# Patient Record
Sex: Female | Born: 1970 | Race: White | Hispanic: No | Marital: Married | Smoking: Former smoker
Health system: Southern US, Community
[De-identification: ages and names within clinical notes are randomized; demographics above are authoritative.]

## PROBLEM LIST (undated history)

## (undated) DIAGNOSIS — K219 Gastro-esophageal reflux disease without esophagitis: Secondary | ICD-10-CM

## (undated) DIAGNOSIS — Z973 Presence of spectacles and contact lenses: Secondary | ICD-10-CM

## (undated) DIAGNOSIS — K635 Polyp of colon: Secondary | ICD-10-CM

## (undated) DIAGNOSIS — B019 Varicella without complication: Secondary | ICD-10-CM

## (undated) DIAGNOSIS — B360 Pityriasis versicolor: Secondary | ICD-10-CM

## (undated) DIAGNOSIS — T7840XA Allergy, unspecified, initial encounter: Secondary | ICD-10-CM

## (undated) DIAGNOSIS — M1990 Unspecified osteoarthritis, unspecified site: Secondary | ICD-10-CM

## (undated) HISTORY — DX: Varicella without complication: B01.9

## (undated) HISTORY — PX: WISDOM TOOTH EXTRACTION: SHX21

## (undated) HISTORY — DX: Unspecified osteoarthritis, unspecified site: M19.90

## (undated) HISTORY — DX: Pityriasis versicolor: B36.0

## (undated) HISTORY — DX: Polyp of colon: K63.5

## (undated) HISTORY — DX: Gastro-esophageal reflux disease without esophagitis: K21.9

## (undated) HISTORY — DX: Allergy, unspecified, initial encounter: T78.40XA

---

## 2006-11-25 HISTORY — PX: ULNAR NERVE REPAIR: SHX2594

## 2016-03-13 DIAGNOSIS — Z1151 Encounter for screening for human papillomavirus (HPV): Secondary | ICD-10-CM | POA: Diagnosis not present

## 2016-03-13 DIAGNOSIS — Z1231 Encounter for screening mammogram for malignant neoplasm of breast: Secondary | ICD-10-CM | POA: Diagnosis not present

## 2016-03-13 DIAGNOSIS — Z01419 Encounter for gynecological examination (general) (routine) without abnormal findings: Secondary | ICD-10-CM | POA: Diagnosis not present

## 2017-04-10 ENCOUNTER — Encounter: Payer: Self-pay | Admitting: Obstetrics and Gynecology

## 2017-04-10 ENCOUNTER — Ambulatory Visit (INDEPENDENT_AMBULATORY_CARE_PROVIDER_SITE_OTHER): Payer: BLUE CROSS/BLUE SHIELD | Admitting: Obstetrics and Gynecology

## 2017-04-10 VITALS — BP 110/60 | HR 82 | Ht 67.0 in | Wt 155.0 lb

## 2017-04-10 DIAGNOSIS — Z01419 Encounter for gynecological examination (general) (routine) without abnormal findings: Secondary | ICD-10-CM | POA: Diagnosis not present

## 2017-04-10 DIAGNOSIS — Z30011 Encounter for initial prescription of contraceptive pills: Secondary | ICD-10-CM | POA: Diagnosis not present

## 2017-04-10 DIAGNOSIS — Z1231 Encounter for screening mammogram for malignant neoplasm of breast: Secondary | ICD-10-CM

## 2017-04-10 DIAGNOSIS — Z1239 Encounter for other screening for malignant neoplasm of breast: Secondary | ICD-10-CM

## 2017-04-10 DIAGNOSIS — Z760 Encounter for issue of repeat prescription: Secondary | ICD-10-CM | POA: Diagnosis not present

## 2017-04-10 MED ORDER — CIPROFLOXACIN HCL 500 MG PO TABS: 500.0000 mg | ORAL_TABLET | Freq: Two times a day (BID) | ORAL | 0 refills | Status: AC

## 2017-04-10 MED ORDER — LEVONORGESTREL-ETHINYL ESTRAD 0.1-20 MG-MCG PO TABS: 1.0000 | ORAL_TABLET | Freq: Every day | ORAL | 2 refills | Status: DC

## 2017-04-10 NOTE — Progress Notes (Signed)
Chief Complaint  Patient presents with  . Gynecologic Exam     HPI:      [name] is a 46 y.o. No obstetric history on file. who LMP was Patient's last menstrual period was 04/06/2017., presents today for her annual examination.  Her menses are regular every 28-30 days, lasting 3 days.  Dysmenorrhea mild, occurring first 1-2 days of flow. She does not have intermenstrual bleeding.  Sex activity: single partner, contraception - vasectomy.  Last Pap: March 13, 2016  Results were: no abnormalities /neg HPV DNA   Last mammogram: March 13, 2016  Results were: normal--routine follow-up in 12 months There is a FH of breast cancer in her mat grt aunt, genetic testing not indicated. There is no FH of ovarian cancer. The patient does do self-breast exams.  Tobacco use: The patient denies current or previous tobacco use. Alcohol use: social drinker Exercise: moderately active  She does get adequate calcium and Vitamin D in her diet.  She is going to [place] in July and would like a Rx for cipro. She also does not want to start her period there and is interested in trying OCPs. She took them in the past and did great. No hx of HTN, clotting disorders, migraine headaches with aura, tob use.   Past Medical History:  Diagnosis Date  . Tinea versicolor     Past Surgical History:  Procedure Laterality Date  . WISDOM TOOTH EXTRACTION      Family History  Problem Relation Age of Onset  . Breast cancer Other     Social History   Social History  . Marital status: Unknown    Spouse name: N/A  . Number of children: N/A  . Years of education: N/A   Occupational History  . Not on file.   Social History Main Topics  . Smoking status: Never Smoker  . Smokeless tobacco: Never Used  . Alcohol use Yes  . Drug use: No  . Sexual activity: Yes    Birth control/ protection: None   Other Topics Concern  . Not on file   Social History Narrative  . No narrative on file      Current Outpatient Prescriptions:  .  cetirizine HCl (ZYRTEC) 1 MG/ML solution, Take by mouth daily., Disp: , Rfl:  .  Probiotic Product (PROBIOTIC-10 PO), Take by mouth., Disp: , Rfl:  .  ciprofloxacin (CIPRO) 500 MG tablet, Take 1 tablet (500 mg total) by mouth 2 (two) times daily., Disp: 14 tablet, Rfl: 0 .  levonorgestrel-ethinyl estradiol (AVIANE) 0.1-20 MG-MCG tablet, Take 1 tablet by mouth daily., Disp: 28 tablet, Rfl: 2  ROS:  Review of Systems  Constitutional: Negative for fatigue, fever and unexpected weight change.  Respiratory: Negative for cough, shortness of breath and wheezing.   Cardiovascular: Negative for chest pain, palpitations and leg swelling.  Gastrointestinal: Negative for blood in stool, constipation, diarrhea, nausea and vomiting.  Endocrine: Negative for cold intolerance, heat intolerance and polyuria.  Genitourinary: Negative for dyspareunia, dysuria, flank pain, frequency, genital sores, hematuria, menstrual problem, pelvic pain, urgency, vaginal bleeding, vaginal discharge and vaginal pain.  Musculoskeletal: Negative for back pain, joint swelling and myalgias.  Skin: Negative for rash.  Neurological: Negative for dizziness, syncope, light-headedness, numbness and headaches.  Hematological: Negative for adenopathy.  Psychiatric/Behavioral: Negative for agitation, confusion, sleep disturbance and suicidal ideas. The patient is not nervous/anxious.      Objective: BP 110/60   Pulse 82   Ht 5\' 7"  (1.702 m)  Wt 155 lb (70.3 kg)   LMP 04/06/2017   BMI 24.28 kg/m    Physical Exam  Constitutional: She is oriented to person, place, and time. She appears well-developed and well-nourished.  Genitourinary: Vagina normal and uterus normal. There is no rash or tenderness on the right labia. There is no rash or tenderness on the left labia. No erythema or tenderness in the vagina. No vaginal discharge found. Right adnexum does not display mass and does not  display tenderness. Left adnexum does not display mass and does not display tenderness. Cervix does not exhibit motion tenderness or polyp. Uterus is not enlarged or tender.  Neck: Normal range of motion. No thyromegaly present.  Cardiovascular: Normal rate, regular rhythm and normal heart sounds.   No murmur heard. Pulmonary/Chest: Effort normal and breath sounds normal. Right breast exhibits no mass, no nipple discharge, no skin change and no tenderness. Left breast exhibits no mass, no nipple discharge, no skin change and no tenderness.  Abdominal: Soft. There is no tenderness. There is no guarding.  Musculoskeletal: Normal range of motion.  Neurological: She is alert and oriented to person, place, and time. No cranial nerve deficit.  Psychiatric: She has a normal mood and affect. Her behavior is normal.  Vitals reviewed.   Assessment/Plan: Encounter for annual routine gynecological examination  Screening for breast cancer - Pt to sched mammo. - Plan: MM DIGITAL SCREENING BILATERAL  Encounter for initial prescription of contraceptive pills - OCP start to prevent menses in [place]. Rx aviane. F/u prn. - Plan: levonorgestrel-ethinyl estradiol (AVIANE) 0.1-20 MG-MCG tablet  Prescription refill - Rx cipro eRxd to take to [place] in case of travelers diarrhea.  - Plan: ciprofloxacin (CIPRO) 500 MG tablet             GYN counsel mammography screening, menopause, adequate intake of calcium and vitamin D, diet and exercise     F/U  Return in about 1 year (around 04/10/2018).  Alicia B. Copland, PA-C 04/10/2017 2:35 PM

## 2017-06-06 ENCOUNTER — Other Ambulatory Visit: Payer: Self-pay

## 2017-06-06 DIAGNOSIS — Z30011 Encounter for initial prescription of contraceptive pills: Secondary | ICD-10-CM

## 2017-06-06 MED ORDER — LEVONORGESTREL-ETHINYL ESTRAD 0.1-20 MG-MCG PO TABS: 1.0000 | ORAL_TABLET | Freq: Every day | ORAL | 0 refills | Status: DC

## 2017-06-10 NOTE — Telephone Encounter (Signed)
This encounter was created in error - please disregard.

## 2017-10-01 ENCOUNTER — Ambulatory Visit
Admission: RE | Admit: 2017-10-01 | Discharge: 2017-10-01 | Disposition: A | Discharge status: Home / Self Care | Payer: BLUE CROSS/BLUE SHIELD | Source: Ambulatory Visit | Attending: Obstetrics and Gynecology | Admitting: Obstetrics and Gynecology

## 2017-10-01 ENCOUNTER — Encounter: Payer: Self-pay | Admitting: Radiology

## 2017-10-01 DIAGNOSIS — Z1231 Encounter for screening mammogram for malignant neoplasm of breast: Secondary | ICD-10-CM | POA: Insufficient documentation

## 2017-10-01 DIAGNOSIS — Z1239 Encounter for other screening for malignant neoplasm of breast: Secondary | ICD-10-CM

## 2017-10-06 ENCOUNTER — Other Ambulatory Visit: Payer: Self-pay | Admitting: *Deleted

## 2017-10-06 ENCOUNTER — Inpatient Hospital Stay
Admission: RE | Admit: 2017-10-06 | Discharge: 2017-10-06 | Disposition: A | Discharge status: Home / Self Care | Payer: Self-pay | Source: Ambulatory Visit | Attending: *Deleted | Admitting: *Deleted

## 2017-10-06 DIAGNOSIS — Z9289 Personal history of other medical treatment: Secondary | ICD-10-CM

## 2017-10-07 ENCOUNTER — Encounter: Payer: Self-pay | Admitting: Obstetrics and Gynecology

## 2018-01-08 DIAGNOSIS — H6691 Otitis media, unspecified, right ear: Secondary | ICD-10-CM | POA: Diagnosis not present

## 2018-09-22 ENCOUNTER — Encounter: Payer: Self-pay | Admitting: Internal Medicine

## 2018-09-22 ENCOUNTER — Ambulatory Visit (INDEPENDENT_AMBULATORY_CARE_PROVIDER_SITE_OTHER): Payer: BLUE CROSS/BLUE SHIELD | Admitting: Internal Medicine

## 2018-09-22 VITALS — BP 104/68 | HR 70 | Temp 98.5°F | Resp 15 | Ht 67.0 in | Wt 160.0 lb

## 2018-09-22 DIAGNOSIS — E559 Vitamin D deficiency, unspecified: Secondary | ICD-10-CM

## 2018-09-22 DIAGNOSIS — Z13220 Encounter for screening for lipoid disorders: Secondary | ICD-10-CM

## 2018-09-22 DIAGNOSIS — K9041 Non-celiac gluten sensitivity: Secondary | ICD-10-CM

## 2018-09-22 DIAGNOSIS — Z0000 Encounter for general adult medical examination without abnormal findings: Secondary | ICD-10-CM

## 2018-09-22 DIAGNOSIS — Z1239 Encounter for other screening for malignant neoplasm of breast: Secondary | ICD-10-CM | POA: Diagnosis not present

## 2018-09-22 DIAGNOSIS — R5383 Other fatigue: Secondary | ICD-10-CM

## 2018-09-22 DIAGNOSIS — Z1211 Encounter for screening for malignant neoplasm of colon: Secondary | ICD-10-CM

## 2018-09-22 DIAGNOSIS — K635 Polyp of colon: Secondary | ICD-10-CM

## 2018-09-22 NOTE — Patient Instructions (Addendum)
GREAT TO MEET YOU!!!  Tell Richard I'm proud of him!  I have ordered labs.  You only need a 4 hour fast.    Remind me in January to make the referral for your colonoscopy   Your mammogram has been ordered.  You can call to make your appointment at [facility]  [phone]   My cell phone  For emergencies is [phone]

## 2018-09-22 NOTE — Progress Notes (Signed)
Subjective:  Patient ID: Brandi Jones, female    DOB: 10/21/1971  Age: 47 y.o. MRN: [id number]  CC: The primary encounter diagnosis was Breast cancer screening. Diagnoses of Vitamin D deficiency, Fatigue, unspecified type, Screening for hyperlipidemia, Encounter for preventive health examination, Non-celiac gluten sensitivity, and Hyperplastic colonic polyp, unspecified part of colon were also pertinent to this visit.  HPI [name] presents for establishment of care .  Want to see Dr. Tobi Bastos for colonoscopy which is due on 2020 .  Marland KitchenHistory of polyp found 5 years ago during diagnotic colonoscopy .  Had some GI issues  Found to be gluten sensitive but not celiac disease.   Exercising regularly , using the [employer]  bike 3 to 4 days weekly, also does hot yoga   .  Some crepitus of knees but no effusions or  pain.   Takes magnesium  at night for sleep, finds it also relaxes muscles,  Fewer cramps.   Adds scoop of collagen peptides in coffee for the past 3 months.    History Masiel has a past medical history of Allergy, Arthritis, Colon polyps, GERD (gastroesophageal reflux disease), Tinea versicolor, and Varicella.   She has a past surgical history that includes Wisdom tooth extraction and Ulnar nerve repair (2008).   Her family history includes Arthritis in her mother; Breast cancer in her other; Celiac disease in her mother; Early death (age of onset: 9) in her father; Hearing loss in her mother.She reports that she has never smoked. She has never used smokeless tobacco. She reports that she drinks alcohol. She reports that she does not use drugs.  Outpatient Medications Prior to Visit  Medication Sig Dispense Refill  . cetirizine HCl (ZYRTEC) 1 MG/ML solution Take by mouth daily.    . Magnesium 500 MG CAPS Take by mouth daily.    . Probiotic Product (PROBIOTIC-10 PO) Take by mouth.    . levonorgestrel-ethinyl estradiol (AVIANE) 0.1-20 MG-MCG tablet Take 1 tablet by mouth  daily. (Patient not taking: Reported on 09/22/2018) 84 tablet 0   No facility-administered medications prior to visit.     Review of Systems:  Patient denies headache, fevers, malaise, unintentional weight loss, skin rash, eye pain, sinus congestion and sinus pain, sore throat, dysphagia,  hemoptysis , cough, dyspnea, wheezing, chest pain, palpitations, orthopnea, edema, abdominal pain, nausea, melena, diarrhea, constipation, flank pain, dysuria, hematuria, urinary  Frequency, nocturia, numbness, tingling, seizures,  Focal weakness, Loss of consciousness,  Tremor, insomnia, depression, anxiety, and suicidal ideation.     Objective:  BP 104/68 (BP Location: Left Arm, Patient Position: Sitting, Cuff Size: Normal)   Pulse 70   Temp 98.5 F (36.9 C) (Oral)   Resp 15   Ht 5\' 7"  (1.702 m)   Wt 160 lb (72.6 kg)   LMP 09/08/2018   SpO2 97%   BMI 25.06 kg/m   Physical Exam:  General appearance: alert, cooperative and appears stated age Ears: normal TM's and external ear canals both ears Throat: lips, mucosa, and tongue normal; teeth and gums normal Neck: no adenopathy, no carotid bruit, supple, symmetrical, trachea midline and thyroid not enlarged, symmetric, no tenderness/mass/nodules Back: symmetric, no curvature. ROM normal. No CVA tenderness. Lungs: clear to auscultation bilaterally Heart: regular rate and rhythm, S1, S2 normal, no murmur, click, rub or gallop Abdomen: soft, non-tender; bowel sounds normal; no masses,  no organomegaly Pulses: 2+ and symmetric Skin: Skin color, texture, turgor normal. No rashes or lesions Lymph nodes: Cervical, supraclavicular, and axillary nodes normal.  Assessment & Plan:   Problem List Items Addressed This Visit    Encounter for preventive health examination    age appropriate education and counseling updated, referrals for preventative services and immunizations addressed, dietary and smoking counseling addressed, most recent labs reviewed.   I have personally reviewed and have noted:  1) the patient's medical and social history 2) The pt's use of alcohol, tobacco, and illicit drugs 3) The patient's current medications and supplements 4) Functional ability including ADL's, fall risk, home safety risk, hearing and visual impairment 5) Diet and physical activities 6) Evidence for depression or mood disorder 7) The patient's height, weight, and BMI have been recorded in the chart  I have made referrals, and provided counseling and education based on review of the above      Hyperplastic colon polyp    Presumed,  Records not available.  Found during diagnostic colonoscopy at age 54 during workup for persistent GI distress.       Non-celiac gluten sensitivity    She continues to enjoy normal bowel function on an 80% gluten free diet. Prior screening for celiac disease was negative        Other Visit Diagnoses    Breast cancer screening    -  Primary   Relevant Orders   MM 3D SCREEN BREAST BILATERAL   Vitamin D deficiency       Relevant Orders   VITAMIN D 25 Hydroxy (Vit-D Deficiency, Fractures)   Fatigue, unspecified type       Relevant Orders   TSH   Comprehensive metabolic panel   CBC with Differential/Platelet   Screening for hyperlipidemia       Relevant Orders   Lipid panel      I have discontinued Dalia Heading levonorgestrel-ethinyl estradiol. I am also having her maintain her Probiotic Product (PROBIOTIC-10 PO), cetirizine HCl, and Magnesium.  No orders of the defined types were placed in this encounter.   Medications Discontinued During This Encounter  Medication Reason  . levonorgestrel-ethinyl estradiol (AVIANE) 0.1-20 MG-MCG tablet Entry Error    Follow-up: No follow-ups on file.   Sherlene Shams, MD

## 2018-09-23 ENCOUNTER — Encounter: Payer: Self-pay | Admitting: Internal Medicine

## 2018-09-23 DIAGNOSIS — K9041 Non-celiac gluten sensitivity: Secondary | ICD-10-CM | POA: Insufficient documentation

## 2018-09-23 DIAGNOSIS — K635 Polyp of colon: Secondary | ICD-10-CM | POA: Insufficient documentation

## 2018-09-23 DIAGNOSIS — Z0000 Encounter for general adult medical examination without abnormal findings: Secondary | ICD-10-CM | POA: Insufficient documentation

## 2018-09-23 NOTE — Assessment & Plan Note (Signed)
She continues to enjoy normal bowel function on an 80% gluten free diet. Prior screening for celiac disease was negative

## 2018-09-23 NOTE — Assessment & Plan Note (Signed)
Presumed,  Records not available.  Found during diagnostic colonoscopy at age 47 during workup for persistent GI distress.

## 2018-09-23 NOTE — Assessment & Plan Note (Signed)

## 2018-10-02 ENCOUNTER — Other Ambulatory Visit (INDEPENDENT_AMBULATORY_CARE_PROVIDER_SITE_OTHER): Payer: BLUE CROSS/BLUE SHIELD

## 2018-10-02 DIAGNOSIS — Z13220 Encounter for screening for lipoid disorders: Secondary | ICD-10-CM

## 2018-10-02 DIAGNOSIS — R5383 Other fatigue: Secondary | ICD-10-CM | POA: Diagnosis not present

## 2018-10-02 DIAGNOSIS — E559 Vitamin D deficiency, unspecified: Secondary | ICD-10-CM

## 2018-10-02 LAB — COMPREHENSIVE METABOLIC PANEL
ALBUMIN: 4.4 g/dL (ref 3.5–5.2)
ALK PHOS: 30 U/L — AB (ref 39–117)
ALT: 14 U/L (ref 0–35)
AST: 15 U/L (ref 0–37)
BUN: 15 mg/dL (ref 6–23)
CHLORIDE: 101 meq/L (ref 96–112)
CO2: 31 meq/L (ref 19–32)
Calcium: 9.5 mg/dL (ref 8.4–10.5)
Creatinine, Ser: 0.89 mg/dL (ref 0.40–1.20)
GFR: 71.42 mL/min (ref >60.00)
Glucose, Bld: 104 mg/dL — ABNORMAL HIGH (ref 70–99)
POTASSIUM: 4.6 meq/L (ref 3.5–5.1)
SODIUM: 138 meq/L (ref 135–145)
Total Bilirubin: 0.8 mg/dL (ref 0.2–1.2)
Total Protein: 7 g/dL (ref 6.0–8.3)

## 2018-10-02 LAB — CBC WITH DIFFERENTIAL/PLATELET
BASOS PCT: 0.7 % (ref 0.0–3.0)
Basophils Absolute: 0 10*3/uL (ref 0.0–0.1)
Eosinophils Absolute: 0.1 10*3/uL (ref 0.0–0.7)
Eosinophils Relative: 1.5 % (ref 0.0–5.0)
HCT: 40.9 % (ref 36.0–46.0)
HEMOGLOBIN: 13.8 g/dL (ref 12.0–15.0)
LYMPHS PCT: 29.9 % (ref 12.0–46.0)
Lymphs Abs: 1.6 10*3/uL (ref 0.7–4.0)
MCHC: 33.7 g/dL (ref 30.0–36.0)
MCV: 95.4 fl (ref 78.0–100.0)
MONO ABS: 0.5 10*3/uL (ref 0.1–1.0)
Monocytes Relative: 9.1 % (ref 3.0–12.0)
Neutro Abs: 3.1 10*3/uL (ref 1.4–7.7)
Neutrophils Relative %: 58.8 % (ref 43.0–77.0)
Platelets: 185 10*3/uL (ref 150.0–400.0)
RBC: 4.29 Mil/uL (ref 3.87–5.11)
RDW: 12.4 % (ref 11.5–15.5)
WBC: 5.2 10*3/uL (ref 4.0–10.5)

## 2018-10-02 LAB — LIPID PANEL
CHOLESTEROL: 203 mg/dL — AB (ref 0–200)
HDL: 75.8 mg/dL (ref >39.00)
LDL CALC: 117 mg/dL — AB (ref 0–99)
NonHDL: 127.51
TRIGLYCERIDES: 54 mg/dL (ref 0.0–149.0)
Total CHOL/HDL Ratio: 3
VLDL: 10.8 mg/dL (ref 0.0–40.0)

## 2018-10-02 LAB — VITAMIN D 25 HYDROXY (VIT D DEFICIENCY, FRACTURES): VITD: 28.49 ng/mL — AB (ref 30.00–100.00)

## 2018-10-02 LAB — TSH: TSH: 2.35 u[IU]/mL (ref 0.35–4.50)

## 2018-10-05 ENCOUNTER — Encounter: Payer: Self-pay | Admitting: *Deleted

## 2018-10-13 ENCOUNTER — Ambulatory Visit
Admission: RE | Admit: 2018-10-13 | Discharge: 2018-10-13 | Disposition: A | Discharge status: Home / Self Care | Payer: BLUE CROSS/BLUE SHIELD | Source: Ambulatory Visit | Attending: Internal Medicine | Admitting: Internal Medicine

## 2018-10-13 DIAGNOSIS — Z1231 Encounter for screening mammogram for malignant neoplasm of breast: Secondary | ICD-10-CM | POA: Diagnosis not present

## 2018-10-13 DIAGNOSIS — Z1239 Encounter for other screening for malignant neoplasm of breast: Secondary | ICD-10-CM | POA: Diagnosis not present

## 2018-11-03 ENCOUNTER — Other Ambulatory Visit: Payer: Self-pay | Admitting: Internal Medicine

## 2018-11-03 MED ORDER — LEVOFLOXACIN 500 MG PO TABS: 500.0000 mg | ORAL_TABLET | Freq: Every day | ORAL | 0 refills | Status: DC

## 2018-11-03 MED ORDER — OSELTAMIVIR PHOSPHATE 75 MG PO CAPS: 75.0000 mg | ORAL_CAPSULE | Freq: Every day | ORAL | 0 refills | Status: DC

## 2018-11-03 MED ORDER — PREDNISONE 10 MG PO TABS: ORAL_TABLET | ORAL | 0 refills | Status: DC

## 2018-11-26 ENCOUNTER — Telehealth: Payer: Self-pay | Admitting: Gastroenterology

## 2018-11-26 ENCOUNTER — Telehealth: Payer: Self-pay

## 2018-11-26 ENCOUNTER — Other Ambulatory Visit: Payer: Self-pay

## 2018-11-26 DIAGNOSIS — Z1211 Encounter for screening for malignant neoplasm of colon: Secondary | ICD-10-CM

## 2018-11-26 NOTE — Telephone Encounter (Signed)
Contacted patient LVM for her to call office to schedule her for a screening colonoscopy will be due in February when she turns 48.  Referral will need to be re-entered when she calls back to schedule.  Thanks [employer]

## 2018-11-26 NOTE — Telephone Encounter (Signed)
Call returned patient scheduled with Dr. T[facility] Tobi BastosAnna. New referral entered.  Thanks [employer]

## 2018-11-26 NOTE — Telephone Encounter (Signed)
-----   Message from Avie Arenas, [place] sent at 09/23/2018  2:27 PM EDT ----- Regarding: Colonoscopy Due Feb 2020 Call pt to schedule colonoscopy with Dr. Tobi Bastos.  Its due 12/2018.  Thanks [employer]

## 2018-11-26 NOTE — Telephone Encounter (Signed)
Patient was returning call to schedule a colonoscopy.

## 2019-01-05 ENCOUNTER — Encounter: Payer: Self-pay | Admitting: *Deleted

## 2019-01-05 ENCOUNTER — Other Ambulatory Visit: Payer: Self-pay

## 2019-01-08 NOTE — Discharge Instructions (Signed)
General Anesthesia, Adult, Care After  This sheet gives you information about how to care for yourself after your procedure. Your health care provider may also give you more specific instructions. If you have problems or questions, contact your health care provider.  What can I expect after the procedure?  After the procedure, the following side effects are common:  Pain or discomfort at the IV site.  Nausea.  Vomiting.  Sore throat.  Trouble concentrating.  Feeling cold or chills.  Weak or tired.  Sleepiness and fatigue.  Soreness and body aches. These side effects can affect parts of the body that were not involved in surgery.  Follow these instructions at home:    For at least 24 hours after the procedure:  Have a responsible adult stay with you. It is important to have someone help care for you until you are awake and alert.  Rest as needed.  Do not:  Participate in activities in which you could fall or become injured.  Drive.  Use heavy machinery.  Drink alcohol.  Take sleeping pills or medicines that cause drowsiness.  Make important decisions or sign legal documents.  Take care of children on your own.  Eating and drinking  Follow any instructions from your health care provider about eating or drinking restrictions.  When you feel hungry, start by eating small amounts of foods that are soft and easy to digest (bland), such as toast. Gradually return to your regular diet.  Drink enough fluid to keep your urine pale yellow.  If you vomit, rehydrate by drinking water, juice, or clear broth.  General instructions  If you have sleep apnea, surgery and certain medicines can increase your risk for breathing problems. Follow instructions from your health care provider about wearing your sleep device:  Anytime you are sleeping, including during daytime naps.  While taking prescription pain medicines, sleeping medicines, or medicines that make you drowsy.  Return to your normal activities as told by your health care  provider. Ask your health care provider what activities are safe for you.  Take over-the-counter and prescription medicines only as told by your health care provider.  If you smoke, do not smoke without supervision.  Keep all follow-up visits as told by your health care provider. This is important.  Contact a health care provider if:  You have nausea or vomiting that does not get better with medicine.  You cannot eat or drink without vomiting.  You have pain that does not get better with medicine.  You are unable to pass urine.  You develop a skin rash.  You have a fever.  You have redness around your IV site that gets worse.  Get help right away if:  You have difficulty breathing.  You have chest pain.  You have blood in your urine or stool, or you vomit blood.  Summary  After the procedure, it is common to have a sore throat or nausea. It is also common to feel tired.  Have a responsible adult stay with you for the first 24 hours after general anesthesia. It is important to have someone help care for you until you are awake and alert.  When you feel hungry, start by eating small amounts of foods that are soft and easy to digest (bland), such as toast. Gradually return to your regular diet.  Drink enough fluid to keep your urine pale yellow.  Return to your normal activities as told by your health care provider. Ask your health care   provider what activities are safe for you.  This information is not intended to replace advice given to you by your health care provider. Make sure you discuss any questions you have with your health care provider.  Document Released: 02/17/2001 Document Revised: 06/27/2017 Document Reviewed: 06/27/2017  Elsevier Interactive Patient Education  2019 Elsevier Inc[employer]

## 2019-01-13 ENCOUNTER — Ambulatory Visit
Admission: RE | Admit: 2019-01-13 | Discharge: 2019-01-13 | Disposition: A | Discharge status: Home / Self Care | Payer: BLUE CROSS/BLUE SHIELD | Attending: Gastroenterology | Admitting: Gastroenterology

## 2019-01-13 ENCOUNTER — Ambulatory Visit: Payer: BLUE CROSS/BLUE SHIELD | Admitting: Anesthesiology

## 2019-01-13 ENCOUNTER — Encounter
Admission: RE | Disposition: A | Discharge status: Home / Self Care | Payer: Self-pay | Source: Home / Self Care | Attending: Gastroenterology

## 2019-01-13 DIAGNOSIS — Z79899 Other long term (current) drug therapy: Secondary | ICD-10-CM | POA: Diagnosis not present

## 2019-01-13 DIAGNOSIS — Z86010 Personal history of colonic polyps: Secondary | ICD-10-CM | POA: Diagnosis not present

## 2019-01-13 DIAGNOSIS — Z1211 Encounter for screening for malignant neoplasm of colon: Secondary | ICD-10-CM | POA: Insufficient documentation

## 2019-01-13 DIAGNOSIS — Z87891 Personal history of nicotine dependence: Secondary | ICD-10-CM | POA: Insufficient documentation

## 2019-01-13 DIAGNOSIS — K219 Gastro-esophageal reflux disease without esophagitis: Secondary | ICD-10-CM | POA: Insufficient documentation

## 2019-01-13 HISTORY — PX: COLONOSCOPY WITH PROPOFOL: SHX5780

## 2019-01-13 HISTORY — DX: Presence of spectacles and contact lenses: Z97.3

## 2019-01-13 LAB — POCT PREGNANCY, URINE: Preg Test, Ur: NEGATIVE

## 2019-01-13 SURGERY — COLONOSCOPY WITH PROPOFOL
Anesthesia: General

## 2019-01-13 MED ORDER — LIDOCAINE HCL (CARDIAC) PF 100 MG/5ML IV SOSY
PREFILLED_SYRINGE | INTRAVENOUS | Status: DC | PRN
  Administered 2019-01-13: 30 mg via INTRAVENOUS

## 2019-01-13 MED ORDER — LACTATED RINGERS IV SOLN
INTRAVENOUS | Status: DC
  Administered 2019-01-13 (×2): via INTRAVENOUS

## 2019-01-13 MED ORDER — PROPOFOL 10 MG/ML IV BOLUS
INTRAVENOUS | Status: DC | PRN
  Administered 2019-01-13 (×2): 50 mg via INTRAVENOUS
  Administered 2019-01-13: 70 mg via INTRAVENOUS
  Administered 2019-01-13: 50 mg via INTRAVENOUS
  Administered 2019-01-13: 30 mg via INTRAVENOUS
  Administered 2019-01-13 (×5): 50 mg via INTRAVENOUS

## 2019-01-13 MED ORDER — ACETAMINOPHEN 325 MG PO TABS: 325.0000 mg | ORAL_TABLET | ORAL | Status: DC | PRN

## 2019-01-13 MED ORDER — ACETAMINOPHEN 160 MG/5ML PO SOLN: 325.0000 mg | ORAL | Status: DC | PRN

## 2019-01-13 SURGICAL SUPPLY — 24 items

## 2019-01-13 NOTE — H&P (Signed)
Wyline Mood, MD [address], [place], [place], [address] [address], [place], [place], [address] Phone: [phone]  Fax: [phone]  Primary Care Physician:  Sherlene Shams, MD   Pre-Procedure History & Physical: HPI:  Brandi Jones is a 48 y.o. female is here for an colonoscopy.   Past Medical History:  Diagnosis Date  . Allergy   . Arthritis    hands  . Colon polyps   . GERD (gastroesophageal reflux disease)   . Tinea versicolor   . Varicella   . Wears contact lenses     Past Surgical History:  Procedure Laterality Date  . ULNAR NERVE REPAIR  2008  . WISDOM TOOTH EXTRACTION      Prior to Admission medications   Medication Sig Start Date End Date Taking? Authorizing Provider  esomeprazole (NEXIUM) 20 MG capsule Take 20 mg by mouth daily as needed.   Yes [provider]  Multiple Vitamin (MULTIVITAMIN) tablet Take 1 tablet by mouth daily.   Yes [provider]  Probiotic Product (PROBIOTIC-10 PO) Take by mouth.   Yes [provider]  cetirizine HCl (ZYRTEC) 1 MG/ML solution Take by mouth daily.    [provider]    Allergies as of 11/26/2018 - Review Complete 09/22/2018  Allergen Reaction Noted  . Penicillin g Other (See Comments) 03/03/2015    Family History  Problem Relation Age of Onset  . Breast cancer Other   . Arthritis Mother   . Hearing loss Mother   . Celiac disease Mother   . Early death Father 76       car accident  . Breast cancer Maternal Aunt        Great MAT    Social History   Socioeconomic History  . Marital status: Married    Spouse name: Not on file  . Number of children: Not on file  . Years of education: Not on file  . Highest education level: Not on file  Occupational History  . Not on file  Social Needs  . Financial resource strain: Not on file  . Food insecurity:    Worry: Not on file    Inability: Not on file  . Transportation needs:    Medical:  Not on file    Non-medical: Not on file  Tobacco Use  . Smoking status: Former Games developer  . Smokeless tobacco: Never Used  . Tobacco comment: Smoked socially in her 86s  Substance and Sexual Activity  . Alcohol use: Yes    Alcohol/week: 6.0 standard drinks    Types: 6 Glasses of wine per week  . Drug use: No  . Sexual activity: Yes    Birth control/protection: None  Lifestyle  . Physical activity:    Days per week: Not on file    Minutes per session: Not on file  . Stress: Not on file  Relationships  . Social connections:    Talks on phone: Not on file    Gets together: Not on file    Attends religious service: Not on file    Active member of club or organization: Not on file    Attends meetings of clubs or organizations: Not on file    Relationship status: Not on file  . Intimate partner violence:    Fear of current or ex partner: Not on file    Emotionally abused: Not on file    Physically abused: Not on file    Forced sexual  activity: Not on file  Other Topics Concern  . Not on file  Social History Narrative  . Not on file    Review of Systems: See HPI, otherwise negative ROS  Physical Exam: BP 105/71   Pulse 60   Temp 98.4 F (36.9 C) (Temporal)   Ht 5\' 7"  (1.702 m)   Wt 71.2 kg   LMP 11/11/2018 (Approximate)   SpO2 99%   BMI 24.59 kg/m  General:   Alert,  pleasant and cooperative in NAD Head:  Normocephalic and atraumatic. Neck:  Supple; no masses or thyromegaly. Lungs:  Clear throughout to auscultation, normal respiratory effort.    Heart:  +S1, +S2, Regular rate and rhythm, No edema. Abdomen:  Soft, nontender and nondistended. Normal bowel sounds, without guarding, and without rebound.   Neurologic:  Alert and  oriented x4;  grossly normal neurologically.  Impression/Plan: Brandi Jones is here for an colonoscopy to be performed for Screening colonoscopy average risk   Risks, benefits, limitations, and alternatives regarding  colonoscopy have been  reviewed with the patient.  Questions have been answered.  All parties agreeable.   Wyline Mood, MD  01/13/2019, 7:39 AM

## 2019-01-13 NOTE — Op Note (Signed)
[facility] Gastroenterology Patient Name: Brandi Jones Procedure Date: 01/13/2019 7:09 AM MRN: [id number] Account #: [id number] Date of Birth: 1971-03-06 Admit Type: Outpatient Age: 48 Room: [facility] OR ROOM 01 Gender: Female Note Status: Finalized Procedure:            Colonoscopy Indications:          Screening for colorectal malignant neoplasm Providers:            Wyline Mood MD, MD Medicines:            Monitored Anesthesia Care Complications:        No immediate complications. Procedure:            Pre-Anesthesia Assessment:                       - Prior to the procedure, a History and Physical was                        performed, and patient medications, allergies and                        sensitivities were reviewed. The patient's tolerance of                        previous anesthesia was reviewed.                       - The risks and benefits of the procedure and the                        sedation options and risks were discussed with the                        patient. All questions were answered and informed                        consent was obtained.                       - ASA Grade Assessment: II - A patient with mild                        systemic disease.                       After obtaining informed consent, the colonoscope was                        passed under direct vision. Throughout the procedure,                        the patient's blood pressure, pulse, and oxygen                        saturations were monitored continuously. The                        Colonoscope was introduced through the anus and                        advanced to the the cecum, identified by the  appendiceal orifice, IC valve and transillumination.                        The colonoscopy was performed with ease. The patient                        tolerated the procedure well. The quality of the bowel                        preparation was  good. Findings:      The perianal and digital rectal examinations were normal.      The entire examined colon appeared normal on direct and retroflexion       views. Impression:           - The entire examined colon is normal on direct and                        retroflexion views.                       - No specimens collected. Recommendation:       - Discharge patient to home (with escort).                       - Resume previous diet.                       - Continue present medications.                       - Repeat colonoscopy in 10 years for screening purposes. Procedure Code(s):    --- Professional ---                       [phone], Colonoscopy, flexible; diagnostic, including                        collection of specimen(s) by brushing or washing, when                        performed (separate procedure) Diagnosis Code(s):    --- Professional ---                       Z12.11, Encounter for screening for malignant neoplasm                        of colon CPT copyright 2018 American Medical Association. All rights reserved. The codes documented in this report are preliminary and upon coder review may  be revised to meet current compliance requirements. Wyline Mood, MD Wyline Mood MD, MD 01/13/2019 8:09:24 AM This report has been signed electronically. Number of Addenda: 0 Note Initiated On: 01/13/2019 7:09 AM Scope Withdrawal Time: 0 hours 18 minutes 25 seconds  Total Procedure Duration: 0 hours 22 minutes 59 seconds       [facility]

## 2019-01-13 NOTE — Transfer of Care (Signed)
Immediate Anesthesia Transfer of Care Note  Patient: Brandi Jones  Procedure(s) Performed: COLONOSCOPY WITH PROPOFOL (N/A )  Patient Location: PACU  Anesthesia Type: General  Level of Consciousness: awake, alert  and patient cooperative  Airway and Oxygen Therapy: Patient Spontanous Breathing and Patient connected to supplemental oxygen  Post-op Assessment: Post-op Vital signs reviewed, Patient's Cardiovascular Status Stable, Respiratory Function Stable, Patent Airway and No signs of Nausea or vomiting  Post-op Vital Signs: Reviewed and stable  Complications: No apparent anesthesia complications

## 2019-01-13 NOTE — Anesthesia Preprocedure Evaluation (Signed)
Anesthesia Evaluation  Patient identified by MRN, date of birth, ID band Patient awake    Reviewed: Allergy & Precautions, H&P , NPO status , Patient's Chart, lab work & pertinent test results, reviewed documented beta blocker date and time   Airway Mallampati: I  TM Distance: >3 FB Neck ROM: full    Dental no notable dental hx.    Pulmonary former smoker,    Pulmonary exam normal breath sounds clear to auscultation       Cardiovascular Exercise Tolerance: Good negative cardio ROS Normal cardiovascular exam Rhythm:regular Rate:Normal     Neuro/Psych negative neurological ROS  negative psych ROS   GI/Hepatic Neg liver ROS, Controlled,  Endo/Other  negative endocrine ROS  Renal/GU negative Renal ROS  negative genitourinary   Musculoskeletal   Abdominal   Peds  Hematology negative hematology ROS (+)   Anesthesia Other Findings   Reproductive/Obstetrics negative OB ROS                             Anesthesia Physical Anesthesia Plan  ASA: II  Anesthesia Plan: General   Post-op Pain Management:    Induction:   PONV Risk Score and Plan:   Airway Management Planned:   Additional Equipment:   Intra-op Plan:   Post-operative Plan:   Informed Consent: I have reviewed the patients History and Physical, chart, labs and discussed the procedure including the risks, benefits and alternatives for the proposed anesthesia with the patient or authorized representative who has indicated his/her understanding and acceptance.     Dental Advisory Given  Plan Discussed with: CRNA and Anesthesiologist  Anesthesia Plan Comments:         Anesthesia Quick Evaluation

## 2019-01-13 NOTE — Anesthesia Postprocedure Evaluation (Signed)
Anesthesia Post Note  Patient: Brandi Jones  Procedure(s) Performed: COLONOSCOPY WITH PROPOFOL (N/A )  Patient location during evaluation: PACU Anesthesia Type: General Level of consciousness: awake and alert Pain management: pain level controlled Vital Signs Assessment: post-procedure vital signs reviewed and stable Respiratory status: spontaneous breathing, nonlabored ventilation and respiratory function stable Cardiovascular status: blood pressure returned to baseline and stable Postop Assessment: no apparent nausea or vomiting Anesthetic complications: no    Alta Corning

## 2019-02-02 ENCOUNTER — Telehealth: Payer: Self-pay | Admitting: *Deleted

## 2019-02-02 NOTE — Telephone Encounter (Signed)
Thanks  Will sign this afternoon

## 2019-02-02 NOTE — Telephone Encounter (Signed)
Starting Friday 01/29/19 has persistent cough,  head congestion , 99 fever Friday and Saturday, flu like symptoms. none since returned from [place] 01/01/19. Very bad malaise , fatigue, today. [facility] health dept, awaiting decision as to  Patient testing.

## 2019-02-02 NOTE — Telephone Encounter (Signed)
Note has been placed up front for pt to come by and pick up.

## 2019-02-02 NOTE — Telephone Encounter (Signed)
-----   Message from Sherlene Shams, MD sent at 02/02/2019 10:00 AM EDT ----- Regarding: corona virus potential Patient just texted me on personal phone about returning from [place] in early February and now with viral syndrome.  Her employer wants her tested before she can return to work.  Can you handle?   [phone]  Thanks   TT

## 2019-02-02 NOTE — Telephone Encounter (Signed)
Brandi Jones  And Crystal from [employer] called back patient does not meet criteria for testing due to patient came home on 01/01/19 and no symptoms til 01/29/19 , [place] [facility] did notify and request direction from the state and the state confirmed patient is way past Covid -19 incubation period. Patient needs letter stating she doe snot meet criteria for testing. Letter written and placed in quick sign.

## 2019-02-02 NOTE — Telephone Encounter (Signed)
Note signed Shanda Bumps to place at front desk.

## 2019-03-16 DIAGNOSIS — S61215A Laceration without foreign body of left ring finger without damage to nail, initial encounter: Secondary | ICD-10-CM | POA: Diagnosis not present

## 2020-03-31 DIAGNOSIS — M4307 Spondylolysis, lumbosacral region: Secondary | ICD-10-CM | POA: Diagnosis not present

## 2020-03-31 DIAGNOSIS — M5116 Intervertebral disc disorders with radiculopathy, lumbar region: Secondary | ICD-10-CM | POA: Diagnosis not present

## 2020-03-31 DIAGNOSIS — M50122 Cervical disc disorder at C5-C6 level with radiculopathy: Secondary | ICD-10-CM | POA: Diagnosis not present

## 2020-03-31 DIAGNOSIS — M4722 Other spondylosis with radiculopathy, cervical region: Secondary | ICD-10-CM | POA: Diagnosis not present

## 2020-03-31 DIAGNOSIS — M4726 Other spondylosis with radiculopathy, lumbar region: Secondary | ICD-10-CM | POA: Diagnosis not present

## 2020-07-27 ENCOUNTER — Other Ambulatory Visit: Payer: Self-pay

## 2020-07-27 ENCOUNTER — Encounter: Payer: Self-pay | Admitting: Nurse Practitioner

## 2020-07-27 ENCOUNTER — Ambulatory Visit (INDEPENDENT_AMBULATORY_CARE_PROVIDER_SITE_OTHER): Payer: BC Managed Care – PPO | Admitting: Nurse Practitioner

## 2020-07-27 VITALS — BP 110/68 | HR 85 | Temp 98.6°F | Ht 67.0 in | Wt 167.0 lb

## 2020-07-27 DIAGNOSIS — H6121 Impacted cerumen, right ear: Secondary | ICD-10-CM | POA: Diagnosis not present

## 2020-07-27 NOTE — Patient Instructions (Addendum)
Right ear with wax impaction.   Try Debrox over the counter.  ENT referral for ear problems with diving- to check inner ear and question about eustachian tube abnormality    Earwax Buildup, Adult The ears produce a substance called earwax that helps keep bacteria out of the ear and protects the skin in the ear canal. Occasionally, earwax can build up in the ear and cause discomfort or hearing loss. What increases the risk? This condition is more likely to develop in people who:  Are female.  Are elderly.  Naturally produce more earwax.  Clean their ears often with cotton swabs.  Use earplugs often.  Use in-ear headphones often.  Wear hearing aids.  Have narrow ear canals.  Have earwax that is overly thick or sticky.  Have eczema.  Are dehydrated.  Have excess hair in the ear canal. What are the signs or symptoms? Symptoms of this condition include:  Reduced or muffled hearing.  A feeling of fullness in the ear or feeling that the ear is plugged.  Fluid coming from the ear.  Ear pain.  Ear itch.  Ringing in the ear.  Coughing.  An obvious piece of earwax that can be seen inside the ear canal. How is this diagnosed? This condition may be diagnosed based on:  Your symptoms.  Your medical history.  An ear exam. During the exam, your health care provider will look into your ear with an instrument called an otoscope. You may have tests, including a hearing test. How is this treated? This condition may be treated by:  Using ear drops to soften the earwax.  Having the earwax removed by a health care provider. The health care provider may: ? Flush the ear with water. ? Use an instrument that has a loop on the end (curette). ? Use a suction device.  Surgery to remove the wax buildup. This may be done in severe cases. Follow these instructions at home:   Take over-the-counter and prescription medicines only as told by your health care provider.  Do not  put any objects, including cotton swabs, into your ear. You can clean the opening of your ear canal with a washcloth or facial tissue.  Follow instructions from your health care provider about cleaning your ears. Do not over-clean your ears.  Drink enough fluid to keep your urine clear or pale yellow. This will help to thin the earwax.  Keep all follow-up visits as told by your health care provider. If earwax builds up in your ears often or if you use hearing aids, consider seeing your health care provider for routine, preventive ear cleanings. Ask your health care provider how often you should schedule your cleanings.  If you have hearing aids, clean them according to instructions from the manufacturer and your health care provider. Contact a health care provider if:  You have ear pain.  You develop a fever.  You have blood, pus, or other fluid coming from your ear.  You have hearing loss.  You have ringing in your ears that does not go away.  Your symptoms do not improve with treatment.  You feel like the room is spinning (vertigo). Summary  Earwax can build up in the ear and cause discomfort or hearing loss.  The most common symptoms of this condition include reduced or muffled hearing and a feeling of fullness in the ear or feeling that the ear is plugged.  This condition may be diagnosed based on your symptoms, your medical history, and  an ear exam.  This condition may be treated by using ear drops to soften the earwax or by having the earwax removed by a health care provider.  Do not put any objects, including cotton swabs, into your ear. You can clean the opening of your ear canal with a washcloth or facial tissue. This information is not intended to replace advice given to you by your health care provider. Make sure you discuss any questions you have with your health care provider. Document Revised: 10/24/2017 Document Reviewed: 01/22/2017 Elsevier Patient Education   2020 [employer].

## 2020-07-27 NOTE — Progress Notes (Signed)
Established Patient Office Visit  Subjective:  Patient ID: Brandi Jones, female    DOB: [birth year]  Age: 49 y.o. MRN: [id number]  CC:  Chief Complaint  Patient presents with  . Acute Visit    possible ear infection    HPI [name] presents for right ear feels like there may be water in it.  She went diving on Sunday night, and always knows that the right ear retains water and does not clear like the left ear does.  She utilizes swimmer's drops. Her right ear did pop and she is hearing a little better but it still feels abnormal. No  pain, drainage, tinnitus, HA or sinus symptoms.  She is diving again in October and wants to know whether she has an anomaly in that right ear.  Whenever she gets to the depths of 10 or feet or below, it bothers her.  She has been experiencing problems with this ear for years.  She would like referral to ENT.  Past Medical History:  Diagnosis Date  . Allergy   . Arthritis    hands  . Colon polyps   . GERD (gastroesophageal reflux disease)   . Tinea versicolor   . Varicella   . Wears contact lenses     Past Surgical History:  Procedure Laterality Date  . COLONOSCOPY WITH PROPOFOL N/A 01/13/2019   Procedure: COLONOSCOPY WITH PROPOFOL;  Surgeon: Wyline Mood, MD;  Location: [facility];  Service: Endoscopy;  Laterality: N/A;  . ULNAR NERVE REPAIR  2008  . WISDOM TOOTH EXTRACTION      Family History  Problem Relation Age of Onset  . Breast cancer Other   . Arthritis Mother   . Hearing loss Mother   . Celiac disease Mother   . Early death Father 63       car accident  . Breast cancer Maternal Aunt        Great MAT    Social History   Socioeconomic History  . Marital status: Married    Spouse name: Not on file  . Number of children: Not on file  . Years of education: Not on file  . Highest education level: Not on file  Occupational History  . Not on file  Tobacco Use  . Smoking status: Former Games developer  . Smokeless  tobacco: Never Used  . Tobacco comment: Smoked socially in her 61s  Vaping Use  . Vaping Use: Never used  Substance and Sexual Activity  . Alcohol use: Yes    Alcohol/week: 6.0 standard drinks    Types: 6 Glasses of wine per week  . Drug use: No  . Sexual activity: Yes    Birth control/protection: None  Other Topics Concern  . Not on file  Social History Narrative  . Not on file   Social Determinants of Health   Financial Resource Strain:   . Difficulty of Paying Living Expenses: Not on file  Food Insecurity:   . Worried About Programme researcher, broadcasting/film/video in the Last Year: Not on file  . Ran Out of Food in the Last Year: Not on file  Transportation Needs:   . Lack of Transportation (Medical): Not on file  . Lack of Transportation (Non-Medical): Not on file  Physical Activity:   . Days of Exercise per Week: Not on file  . Minutes of Exercise per Session: Not on file  Stress:   . Feeling of Stress : Not on file  Social Connections:   . Frequency  of Communication with Friends and Family: Not on file  . Frequency of Social Gatherings with Friends and Family: Not on file  . Attends Religious Services: Not on file  . Active Member of Clubs or Organizations: Not on file  . Attends Banker Meetings: Not on file  . Marital Status: Not on file  Intimate Partner Violence:   . Fear of Current or Ex-Partner: Not on file  . Emotionally Abused: Not on file  . Physically Abused: Not on file  . Sexually Abused: Not on file    Outpatient Medications Prior to Visit  Medication Sig Dispense Refill  . cetirizine HCl (ZYRTEC) 1 MG/ML solution Take by mouth daily.    Marland Kitchen esomeprazole (NEXIUM) 20 MG capsule Take 20 mg by mouth daily as needed.    . Multiple Vitamin (MULTIVITAMIN) tablet Take 1 tablet by mouth daily.    . Probiotic Product (PROBIOTIC-10 PO) Take by mouth.     No facility-administered medications prior to visit.    Allergies  Allergen Reactions  . Gluten Meal     GI  Upset  . Penicillin G Other (See Comments)    Unknown childhood reaction    Review of Systems  Constitutional: Negative.   HENT: Negative for congestion, ear discharge, ear pain, hearing loss, postnasal drip, rhinorrhea, sinus pain, sore throat and tinnitus.   Eyes: Negative.   Respiratory: Negative.   Cardiovascular: Negative.       Objective:    Physical Exam Vitals reviewed.  Constitutional:      Appearance: Normal appearance. She is normal weight.  HENT:     Head: Normocephalic.     Right Ear: There is impacted cerumen.     Left Ear: There is no impacted cerumen.     Ears:     Comments: Right ext ear. Tiny healing scab. TM obscured with cerumen.  Eyes:     Conjunctiva/sclera: Conjunctivae normal.     Pupils: Pupils are equal, round, and reactive to light.  Pulmonary:     Effort: Pulmonary effort is normal.  Abdominal:     Tenderness: There is no abdominal tenderness.  Musculoskeletal:        General: No swelling.     Cervical back: Normal range of motion.  Skin:    General: Skin is warm and dry.  Neurological:     General: No focal deficit present.     Mental Status: She is alert and oriented to person, place, and time.  Psychiatric:        Mood and Affect: Mood normal.        Behavior: Behavior normal.     BP 110/68 (BP Location: Left Arm, Patient Position: Sitting, Cuff Size: Normal)   Pulse 85   Temp 98.6 F (37 C) (Oral)   Ht 5\' 7"  (1.702 m)   Wt 167 lb (75.8 kg)   SpO2 97%   BMI 26.16 kg/m  Wt Readings from Last 3 Encounters:  07/27/20 167 lb (75.8 kg)  01/13/19 157 lb (71.2 kg)  09/22/18 160 lb (72.6 kg)   Pulse Readings from Last 3 Encounters:  07/27/20 85  01/13/19 (!) 57  09/22/18 70    BP Readings from Last 3 Encounters:  07/27/20 110/68  01/13/19 108/71  09/22/18 104/68    Lab Results  Component Value Date   CHOL 203 (H) 10/02/2018   HDL 75.80 10/02/2018   LDLCALC 117 (H) 10/02/2018   TRIG 54.0 10/02/2018   CHOLHDL 3  10/02/2018  Health Maintenance Due  Topic Date Due  . Hepatitis C Screening  Never done  . HIV Screening  Never done  . TETANUS/TDAP  Never done  . PAP SMEAR-Modifier  Never done    There are no preventive care reminders to display for this patient.  Lab Results  Component Value Date   TSH 2.35 10/02/2018   Lab Results  Component Value Date   WBC 5.2 10/02/2018   HGB 13.8 10/02/2018   HCT 40.9 10/02/2018   MCV 95.4 10/02/2018   PLT 185.0 10/02/2018   Lab Results  Component Value Date   NA 138 10/02/2018   K 4.6 10/02/2018   CO2 31 10/02/2018   GLUCOSE 104 (H) 10/02/2018   BUN 15 10/02/2018   CREATININE 0.89 10/02/2018   BILITOT 0.8 10/02/2018   ALKPHOS 30 (L) 10/02/2018   AST 15 10/02/2018   ALT 14 10/02/2018   PROT 7.0 10/02/2018   ALBUMIN 4.4 10/02/2018   CALCIUM 9.5 10/02/2018   GFR 71.42 10/02/2018   Lab Results  Component Value Date   CHOL 203 (H) 10/02/2018   Lab Results  Component Value Date   HDL 75.80 10/02/2018   Lab Results  Component Value Date   LDLCALC 117 (H) 10/02/2018   Lab Results  Component Value Date   TRIG 54.0 10/02/2018   Lab Results  Component Value Date   CHOLHDL 3 10/02/2018   No results found for: HGBA1C    Assessment & Plan:   Problem List Items Addressed This Visit      Nervous and Auditory   Impacted cerumen of right ear - Primary   Relevant Orders   Ambulatory referral to ENT     No orders of the defined types were placed in this encounter.  Right ear with wax impaction.   Try Debrox over the counter.  ENT referral for ear problems with diving- to check inner ear and question about eustachian tube abnormality.  Follow-up: Return if symptoms worsen or fail to improve.   This visit occurred during the SARS-CoV-2 public health emergency.  Safety protocols were in place, including screening questions prior to the visit, additional usage of staff PPE, and extensive cleaning of exam room while observing  appropriate contact time as indicated for disinfecting solutions.   Amedeo Kinsman, NP

## 2020-07-29 ENCOUNTER — Encounter: Payer: Self-pay | Admitting: Nurse Practitioner

## 2020-08-03 DIAGNOSIS — H6980 Other specified disorders of Eustachian tube, unspecified ear: Secondary | ICD-10-CM | POA: Diagnosis not present

## 2020-08-03 DIAGNOSIS — J301 Allergic rhinitis due to pollen: Secondary | ICD-10-CM | POA: Diagnosis not present

## 2020-09-13 ENCOUNTER — Telehealth: Payer: Self-pay | Admitting: Internal Medicine

## 2020-09-13 NOTE — Telephone Encounter (Signed)
Lm on vm to let patient know Dr. Darrick Huntsman does not have any available cpe appointment's for 10/28 or 09/22/20. Patient was asked to call office to set up a physical appointment.

## 2020-10-13 ENCOUNTER — Encounter: Payer: Self-pay | Admitting: Internal Medicine

## 2020-10-13 ENCOUNTER — Other Ambulatory Visit: Payer: Self-pay

## 2020-10-13 ENCOUNTER — Ambulatory Visit (INDEPENDENT_AMBULATORY_CARE_PROVIDER_SITE_OTHER): Payer: BC Managed Care – PPO | Admitting: Internal Medicine

## 2020-10-13 ENCOUNTER — Other Ambulatory Visit (HOSPITAL_COMMUNITY)
Admission: RE | Admit: 2020-10-13 | Discharge: 2020-10-13 | Disposition: A | Discharge status: Home / Self Care | Payer: BC Managed Care – PPO | Source: Ambulatory Visit | Attending: Internal Medicine | Admitting: Internal Medicine

## 2020-10-13 VITALS — BP 110/72 | HR 56 | Temp 98.3°F | Resp 15 | Ht 67.0 in | Wt 162.0 lb

## 2020-10-13 DIAGNOSIS — Z1231 Encounter for screening mammogram for malignant neoplasm of breast: Secondary | ICD-10-CM

## 2020-10-13 DIAGNOSIS — Z124 Encounter for screening for malignant neoplasm of cervix: Secondary | ICD-10-CM

## 2020-10-13 DIAGNOSIS — R5383 Other fatigue: Secondary | ICD-10-CM | POA: Diagnosis not present

## 2020-10-13 DIAGNOSIS — E559 Vitamin D deficiency, unspecified: Secondary | ICD-10-CM | POA: Diagnosis not present

## 2020-10-13 DIAGNOSIS — Z23 Encounter for immunization: Secondary | ICD-10-CM

## 2020-10-13 DIAGNOSIS — N951 Menopausal and female climacteric states: Secondary | ICD-10-CM | POA: Insufficient documentation

## 2020-10-13 DIAGNOSIS — Z0000 Encounter for general adult medical examination without abnormal findings: Secondary | ICD-10-CM | POA: Diagnosis not present

## 2020-10-13 DIAGNOSIS — M5030 Other cervical disc degeneration, unspecified cervical region: Secondary | ICD-10-CM | POA: Insufficient documentation

## 2020-10-13 DIAGNOSIS — R7301 Impaired fasting glucose: Secondary | ICD-10-CM

## 2020-10-13 LAB — VITAMIN D 25 HYDROXY (VIT D DEFICIENCY, FRACTURES): VITD: 26.7 ng/mL — ABNORMAL LOW (ref 30.00–100.00)

## 2020-10-13 LAB — COMPREHENSIVE METABOLIC PANEL
ALT: 14 U/L (ref 0–35)
AST: 17 U/L (ref 0–37)
Albumin: 4.7 g/dL (ref 3.5–5.2)
Alkaline Phosphatase: 46 U/L (ref 39–117)
BUN: 13 mg/dL (ref 6–23)
CO2: 32 mEq/L (ref 19–32)
Calcium: 10 mg/dL (ref 8.4–10.5)
Chloride: 100 mEq/L (ref 96–112)
Creatinine, Ser: 0.89 mg/dL (ref 0.40–1.20)
GFR: 74.87 mL/min (ref >60.00)
Glucose, Bld: 99 mg/dL (ref 70–99)
Potassium: 4 mEq/L (ref 3.5–5.1)
Sodium: 140 mEq/L (ref 135–145)
Total Bilirubin: 0.7 mg/dL (ref 0.2–1.2)
Total Protein: 7.3 g/dL (ref 6.0–8.3)

## 2020-10-13 LAB — HEMOGLOBIN A1C: Hgb A1c MFr Bld: 5.5 % (ref 4.6–6.5)

## 2020-10-13 LAB — TSH: TSH: 1.66 u[IU]/mL (ref 0.35–4.50)

## 2020-10-13 MED ORDER — ZOSTER VAC RECOMB ADJUVANTED 50 MCG/0.5ML IM SUSR: 0.5000 mL | Freq: Once | INTRAMUSCULAR | 1 refills | Status: AC

## 2020-10-13 NOTE — Patient Instructions (Signed)
The ShingRx vaccine is now available in local pharmacies and is much more protective than the old one  Zostavax  (it is about 97%  Effective in preventing shingles). .   It is therefore ADVISED for all interested adults over 50 to prevent shingles so I have printed you a prescription for it.  (it requires a 2nd dose 2 too 6 months after the first one) .  It will cause you to have flu  like symptoms for 2 days   Your annual mammogram has been ordered.  You are encouraged (required) to call to make your appointment at [place]  [phone]   Health Maintenance for Postmenopausal Women Menopause is a normal process in which your ability to get pregnant comes to an end. This process happens slowly over many months or years, usually between the ages of 56 and 109. Menopause is complete when you have missed your menstrual periods for 12 months. It is important to talk with your health care provider about some of the most common conditions that affect women after menopause (postmenopausal women). These include heart disease, cancer, and bone loss (osteoporosis). Adopting a healthy lifestyle and getting preventive care can help to promote your health and wellness. The actions you take can also lower your chances of developing some of these common conditions. What should I know about menopause? During menopause, you may get a number of symptoms, such as:  Hot flashes. These can be moderate or severe.  Night sweats.  Decrease in sex drive.  Mood swings.  Headaches.  Tiredness.  Irritability.  Memory problems.  Insomnia. Choosing to treat or not to treat these symptoms is a decision that you make with your health care provider. Do I need hormone replacement therapy?  Hormone replacement therapy is effective in treating symptoms that are caused by menopause, such as hot flashes and night sweats.  Hormone replacement carries certain risks, especially as you become older. If you are thinking  about using estrogen or estrogen with progestin, discuss the benefits and risks with your health care provider. What is my risk for heart disease and stroke? The risk of heart disease, heart attack, and stroke increases as you age. One of the causes may be a change in the body's hormones during menopause. This can affect how your body uses dietary fats, triglycerides, and cholesterol. Heart attack and stroke are medical emergencies. There are many things that you can do to help prevent heart disease and stroke. Watch your blood pressure  High blood pressure causes heart disease and increases the risk of stroke. This is more likely to develop in people who have high blood pressure readings, are of African descent, or are overweight.  Have your blood pressure checked: ? Every 3-5 years if you are 6-9 years of age. ? Every year if you are 57 years old or older. Eat a healthy diet   Eat a diet that includes plenty of vegetables, fruits, low-fat dairy products, and lean protein.  Do not eat a lot of foods that are high in solid fats, added sugars, or sodium. Get regular exercise Get regular exercise. This is one of the most important things you can do for your health. Most adults should:  Try to exercise for at least 150 minutes each week. The exercise should increase your heart rate and make you sweat (moderate-intensity exercise).  Try to do strengthening exercises at least twice each week. Do these in addition to the moderate-intensity exercise.  Spend less time  sitting. Even light physical activity can be beneficial. Other tips  Work with your health care provider to achieve or maintain a healthy weight.  Do not use any products that contain nicotine or tobacco, such as cigarettes, e-cigarettes, and chewing tobacco. If you need help quitting, ask your health care provider.  Know your numbers. Ask your health care provider to check your cholesterol and your blood sugar (glucose).  Continue to have your blood tested as directed by your health care provider. Do I need screening for cancer? Depending on your health history and family history, you may need to have cancer screening at different stages of your life. This may include screening for:  Breast cancer.  Cervical cancer.  Lung cancer.  Colorectal cancer. What is my risk for osteoporosis? After menopause, you may be at increased risk for osteoporosis. Osteoporosis is a condition in which bone destruction happens more quickly than new bone creation. To help prevent osteoporosis or the bone fractures that can happen because of osteoporosis, you may take the following actions:  If you are 53-46 years old, get at least 1,000 mg of calcium and at least 600 mg of vitamin D per day.  If you are older than age 33 but younger than age 50, get at least 1,200 mg of calcium and at least 600 mg of vitamin D per day.  If you are older than age 68, get at least 1,200 mg of calcium and at least 800 mg of vitamin D per day. Smoking and drinking excessive alcohol increase the risk of osteoporosis. Eat foods that are rich in calcium and vitamin D, and do weight-bearing exercises several times each week as directed by your health care provider. How does menopause affect my mental health? Depression may occur at any age, but it is more common as you become older. Common symptoms of depression include:  Low or sad mood.  Changes in sleep patterns.  Changes in appetite or eating patterns.  Feeling an overall lack of motivation or enjoyment of activities that you previously enjoyed.  Frequent crying spells. Talk with your health care provider if you think that you are experiencing depression. General instructions See your health care provider for regular wellness exams and vaccines. This may include:  Scheduling regular health, dental, and eye exams.  Getting and maintaining your vaccines. These include: ? Influenza vaccine.  Get this vaccine each year before the flu season begins. ? Pneumonia vaccine. ? Shingles vaccine. ? Tetanus, diphtheria, and pertussis (Tdap) booster vaccine. Your health care provider may also recommend other immunizations. Tell your health care provider if you have ever been abused or do not feel safe at home. Summary  Menopause is a normal process in which your ability to get pregnant comes to an end.  This condition causes hot flashes, night sweats, decreased interest in sex, mood swings, headaches, or lack of sleep.  Treatment for this condition may include hormone replacement therapy.  Take actions to keep yourself healthy, including exercising regularly, eating a healthy diet, watching your weight, and checking your blood pressure and blood sugar levels.  Get screened for cancer and depression. Make sure that you are up to date with all your vaccines. This information is not intended to replace advice given to you by your health care provider. Make sure you discuss any questions you have with your health care provider. Document Revised: 11/04/2018 Document Reviewed: 11/04/2018 Elsevier Patient Education  2020 [employer].

## 2020-10-13 NOTE — Progress Notes (Signed)
Patient ID: Brandi Jones, female    DOB: [birth year]  Age: 49 y.o. MRN: [id number]  The patient is here for annual preventive examination and management of other chronic and acute problems.  This visit occurred during the SARS-CoV-2 public health emergency.  Safety protocols were in place, including screening questions prior to the visit, additional usage of staff PPE, and extensive cleaning of exam room while observing appropriate contact time as indicated for disinfecting solutions.    Patient has received both doses of the available COVID 19 vaccine without complications.  Patient continues to mask when outside of the home except when walking in yard or at safe distances from others .  Patient denies any change in mood or development of unhealthy behaviors resuting from the pandemic's restriction of activities and socialization.     The risk factors are reflected in the social history.  The roster of all physicians providing medical care to patient - is listed in the Snapshot section of the chart.  Activities of daily living:  The patient is 100% independent in all ADLs: dressing, toileting, feeding as well as independent mobility  Home safety : The patient has smoke detectors in the home. They wear seatbelts.  There are no unsecured firearms at home. There is no violence in the home.   There is no risks for hepatitis, STDs or HIV. There is no   history of blood transfusion. They have no travel history to infectious disease endemic areas of the world.  The patient has seen their dentist in the last six month. They have seen their eye doctor in the last year. They deny hearing difficulty with regard to whispered voices and some television programs.  They have deferred audiologic testing in the last year.  They do not  have excessive sun exposure. Discussed the need for sun protection: hats, long sleeves and use of sunscreen if there is significant sun exposure.   Diet: the importance of a  healthy diet is discussed. They do have a healthy diet.  The benefits of regular aerobic exercise were discussed. She uses the  [employer] bike ,  [employer] 5 days per per week    Depression screen: there are no signs or vegative symptoms of depression- irritability, change in appetite, anhedonia, sadness/tearfullness.  The following portions of the patient's history were reviewed and updated as appropriate: allergies, current medications, past family history, past medical history,  past surgical history, past social history  and problem list.  Visual acuity was not assessed per patient preference since she has regular follow up with her ophthalmologist. Hearing and body mass index were assessed and reviewed.   During the course of the visit the patient was educated and counseled about appropriate screening and preventive services including : fall prevention , diabetes screening, nutrition counseling, colorectal cancer screening, and recommended immunizations.    CC: The primary encounter diagnosis was Impaired fasting glucose. Diagnoses of Fatigue, unspecified type, COVID-19 vaccine regimen to maintain immunity completed, Vitamin D deficiency, Encounter for screening mammogram for malignant neoplasm of breast, Cervical cancer screening, Need for diphtheria-tetanus-pertussis (Tdap) vaccine, Perimenopause, Encounter for preventive health examination, and Degeneration of cervical intervertebral disc were also pertinent to this visit.  Menopause symptoms.  Taking an OTC natural supplement that she felt she might have been taking too much of .  Has stopped it.  Last period Sept 24 .  Has not taken birth control since  70's.    History Brandi Jones has a past medical history of  Allergy, Arthritis, Colon polyps, GERD (gastroesophageal reflux disease), Tinea versicolor, Varicella, and Wears contact lenses.   She has a past surgical history that includes Wisdom tooth extraction; Ulnar nerve repair (2008);  and Colonoscopy with propofol (N/A, 01/13/2019).   Her family history includes Arthritis in her mother; Breast cancer in her maternal aunt and another family member; Celiac disease in her mother; Early death (age of onset: 80) in her father; Hearing loss in her mother.She reports that she has quit smoking. She has never used smokeless tobacco. She reports current alcohol use of about 6.0 standard drinks of alcohol per week. She reports that she does not use drugs.  Outpatient Medications Prior to Visit  Medication Sig Dispense Refill  . cetirizine HCl (ZYRTEC) 1 MG/ML solution Take by mouth daily.    Marland Kitchen esomeprazole (NEXIUM) 20 MG capsule Take 20 mg by mouth daily as needed.    . Magnesium 400 MG TABS Take 1 tablet by mouth daily.    . Omega-3 Fatty Acids (FISH OIL) 1000 MG CAPS Take 1 capsule by mouth daily.    . Probiotic Product (PROBIOTIC-10 PO) Take by mouth.    . Multiple Vitamin (MULTIVITAMIN) tablet Take 1 tablet by mouth daily.     No facility-administered medications prior to visit.    Review of Systems   Patient denies headache, fevers, malaise, unintentional weight loss, skin rash, eye pain, sinus congestion and sinus pain, sore throat, dysphagia,  hemoptysis , cough, dyspnea, wheezing, chest pain, palpitations, orthopnea, edema, abdominal pain, nausea, melena, diarrhea, constipation, flank pain, dysuria, hematuria, urinary  Frequency, nocturia, numbness, tingling, seizures,  Focal weakness, Loss of consciousness,  Tremor, insomnia, depression, anxiety, and suicidal ideation.      Objective:  BP 110/72 (BP Location: Left Arm, Patient Position: Sitting, Cuff Size: Normal)   Pulse (!) 56   Temp 98.3 F (36.8 C) (Oral)   Resp 15   Ht 5\' 7"  (1.702 m)   Wt 162 lb (73.5 kg)   SpO2 98%   BMI 25.37 kg/m   Physical Exam  General Appearance:    Alert, cooperative, no distress, appears stated age  Head:    Normocephalic, without obvious abnormality, atraumatic  Eyes:    PERRL,  conjunctiva/corneas clear, EOM's intact, fundi    benign, both eyes  Ears:    Normal TM's and external ear canals, both ears  Nose:   Nares normal, septum midline, mucosa normal, no drainage    or sinus tenderness  Throat:   Lips, mucosa, and tongue normal; teeth and gums normal  Neck:   Supple, symmetrical, trachea midline, no adenopathy;    thyroid:  no enlargement/tenderness/nodules; no carotid   bruit or JVD  Back:     Symmetric, no curvature, ROM normal, no CVA tenderness  Lungs:     Clear to auscultation bilaterally, respirations unlabored  Chest Wall:    No tenderness or deformity   Heart:    Regular rate and rhythm, S1 and S2 normal, no murmur, rub   or gallop  Breast Exam:    No tenderness, masses, or nipple abnormality  Abdomen:     Soft, non-tender, bowel sounds active all four quadrants,    no masses, no organomegaly  Genitalia:    Pelvic: cervix normal in appearance, external genitalia normal, no adnexal masses or tenderness, no cervical motion tenderness, rectovaginal septum normal, uterus normal size, shape, and consistency and vagina normal without discharge  Extremities:   Extremities normal, atraumatic, no cyanosis or edema  Pulses:  2+ and symmetric all extremities  Skin:   Skin color, texture, turgor normal, no rashes or lesions  Lymph nodes:   Cervical, supraclavicular, and axillary nodes normal  Neurologic:   CNII-XII intact, normal strength, sensation and reflexes    throughout     Assessment & Plan:   Problem List Items Addressed This Visit      Unprioritized   Perimenopause    Has not had a menstrual period in 4 months. Patient having hot flushes, mood lability and occasional mental fogginess. Discussed the various hormonal and  Non hormonal ways to treat symptoms, and the risks and benefits associated with all.   She has deferred hormonal and SSRI therapy       Encounter for preventive health examination    age appropriate education and counseling  updated, referrals for preventative services and immunizations addressed, dietary and smoking counseling addressed, most recent labs reviewed.  I have personally reviewed and have noted:  1) the patient's medical and social history 2) The pt's use of alcohol, tobacco, and illicit drugs 3) The patient's current medications and supplements 4) Functional ability including ADL's, fall risk, home safety risk, hearing and visual impairment 5) Diet and physical activities 6) Evidence for depression or mood disorder 7) The patient's height, weight, and BMI have been recorded in the chart  I have made referrals, and provided counseling and education based on review of the above      Degeneration of cervical intervertebral disc    Receiving chiropractic manipulation and monthly massage for C5-6 issues,  Chronic        Other Visit Diagnoses    Impaired fasting glucose    -  Primary   Relevant Orders   Hemoglobin A1c   Comprehensive metabolic panel   Fatigue, unspecified type       Relevant Orders   TSH   COVID-19 vaccine regimen to maintain immunity completed       Relevant Orders   SARS-CoV-2 Semi-Quantitative Total Antibody, Spike   Vitamin D deficiency       Relevant Orders   VITAMIN D 25 Hydroxy (Vit-D Deficiency, Fractures)   Encounter for screening mammogram for malignant neoplasm of breast       Relevant Orders   MM 3D SCREEN BREAST BILATERAL   Cervical cancer screening       Relevant Orders   Cytology - PAP( [place])   Need for diphtheria-tetanus-pertussis (Tdap) vaccine       Relevant Orders   Tdap vaccine greater than or equal to 7yo IM (Completed)      I have discontinued Liborio Nixon Diffee's multivitamin. I am also having her start on Zoster Vaccine Adjuvanted. Additionally, I am having her maintain her Probiotic Product (PROBIOTIC-10 PO), cetirizine HCl, esomeprazole, Magnesium, and Fish Oil.  Meds ordered this encounter  Medications  . Zoster Vaccine Adjuvanted  [facility]) injection    Sig: Inject 0.5 mLs into the muscle once for 1 dose.    Dispense:  1 each    Refill:  1    Medications Discontinued During This Encounter  Medication Reason  . Multiple Vitamin (MULTIVITAMIN) tablet     Follow-up: Return in about 1 year (around 10/13/2021).   Sherlene Shams, MD

## 2020-10-13 NOTE — Assessment & Plan Note (Signed)
Has not had a menstrual period in 4 months. Patient having hot flushes, mood lability and occasional mental fogginess. Discussed the various hormonal and  Non hormonal ways to treat symptoms, and the risks and benefits associated with all.   She has deferred hormonal and SSRI therapy

## 2020-10-13 NOTE — Assessment & Plan Note (Signed)

## 2020-10-13 NOTE — Assessment & Plan Note (Signed)
Receiving chiropractic manipulation and monthly massage for C5-6 issues,  Chronic

## 2020-10-14 LAB — SARS-COV-2 SEMI-QUANTITATIVE TOTAL ANTIBODY, SPIKE
SARS-CoV-2 Semi-Quant Total Ab: 75.5 U/mL (ref <0.8)
SARS-CoV-2 Spike Ab Interp: POSITIVE

## 2020-10-16 NOTE — Progress Notes (Signed)
You have a very low COVID antibody titre. I recommend getting the booster   Your vitamin D is a little low again.   I recommend that  you should start taking an OTC  Vit D3 supplement 2000 units daily, November through April.   The rest of your labs are normal.    Regards,   Duncan Dull, MD

## 2020-10-17 LAB — CYTOLOGY - PAP
Adequacy: ABSENT
Comment: NEGATIVE
Diagnosis: NEGATIVE
High risk HPV: NEGATIVE

## 2020-10-18 NOTE — Progress Notes (Signed)
  Your PAP smear was HPV negative but the endocervical zone was not present . Current guidelines for women over 30 with these results recommend continued routine screening,  So we should repeat your PAP smear in a year.

## 2021-01-05 ENCOUNTER — Other Ambulatory Visit: Payer: Self-pay

## 2021-01-05 ENCOUNTER — Ambulatory Visit
Admission: RE | Admit: 2021-01-05 | Discharge: 2021-01-05 | Disposition: A | Discharge status: Home / Self Care | Payer: No Typology Code available for payment source | Source: Ambulatory Visit | Attending: Internal Medicine | Admitting: Internal Medicine

## 2021-01-05 DIAGNOSIS — Z1231 Encounter for screening mammogram for malignant neoplasm of breast: Secondary | ICD-10-CM | POA: Diagnosis not present

## 2021-09-24 ENCOUNTER — Telehealth: Payer: No Typology Code available for payment source | Admitting: Family Medicine

## 2021-09-24 DIAGNOSIS — J029 Acute pharyngitis, unspecified: Secondary | ICD-10-CM

## 2021-09-24 MED ORDER — AZITHROMYCIN 250 MG PO TABS: ORAL_TABLET | ORAL | 0 refills | Status: AC

## 2021-09-24 NOTE — Progress Notes (Signed)
E-Visit for Sore Throat - Strep Symptoms  We are sorry that you are not feeling well.  Here is how we plan to help!  Based on what you have shared with me it is likely that you have strep pharyngitis.  Strep pharyngitis is inflammation and infection in the back of the throat.  This is an infection cause by bacteria and is treated with antibiotics.  I have prescribed Azithromycin 250 mg two tablets today and then one daily for 4 additional days. For throat pain, we recommend over the counter oral pain relief medications such as acetaminophen or aspirin, or anti-inflammatory medications such as ibuprofen or naproxen sodium. Topical treatments such as oral throat lozenges or sprays may be used as needed. Strep infections are not as easily transmitted as other respiratory infections, however we still recommend that you avoid close contact with loved ones, especially the very young and elderly.  Remember to wash your hands thoroughly throughout the day as this is the number one way to prevent the spread of infection and wipe down door knobs and counters with disinfectant.   Home Care: Only take medications as instructed by your medical team. Complete the entire course of an antibiotic. Do not take these medications with alcohol. A steam or ultrasonic humidifier can help congestion.  You can place a towel over your head and breathe in the steam from hot water coming from a faucet. Avoid close contacts especially the very young and the elderly. Cover your mouth when you cough or sneeze. Always remember to wash your hands.  Get Help Right Away If: You develop worsening fever or sinus pain. You develop a severe head ache or visual changes. Your symptoms persist after you have completed your treatment plan.  Make sure you Understand these instructions. Will watch your condition. Will get help right away if you are not doing well or get worse.   Thank you for choosing an e-visit.  Your e-visit  answers were reviewed by a board certified advanced clinical practitioner to complete your personal care plan. Depending upon the condition, your plan could have included both over the counter or prescription medications.  Please review your pharmacy choice. Make sure the pharmacy is open so you can pick up prescription now. If there is a problem, you may contact your provider through MyChart messaging and have the prescription routed to another pharmacy.  Your safety is important to us. If you have drug allergies check your prescription carefully.   For the next 24 hours you can use MyChart to ask questions about today's visit, request a non-urgent call back, or ask for a work or school excuse. You will get an email in the next two days asking about your experience. I hope that your e-visit has been valuable and will speed your recovery.  I provided 5 minutes of non face-to-face time during this encounter for chart review, medication and order placement, as well as and documentation.    

## 2021-09-25 ENCOUNTER — Telehealth: Payer: Self-pay

## 2021-09-25 NOTE — Telephone Encounter (Signed)
[name], CMA 1 hour ago (8:18 AM)   LMTCB. Need to see if pt can do a virtual today at 1:30pm.       Note    Brandi Jones  P [facility] (supporting Sherlene Shams, MD) Yesterday (9:29 AM)   [name] Morning!   I started with a sore throat on Friday. Now head congestion and ears ache on and off. See some white spots on throat.    Taking decongestant. Feel better than yesterday. No nasty junk coming out of nose. No cough.    Unfortunately my 56yr old mother is having surgery on Friday and I'm going to take care of her for the weekend. Don't want to get her sick.    Thoughts on antibiotics? Or stay the course?    Thank you!!  Brandi Jones

## 2021-09-25 NOTE — Telephone Encounter (Signed)
Patient had an e-visit . She was prescribed and antibiotic. Will not need a visit.

## 2021-09-25 NOTE — Telephone Encounter (Signed)
LMTCB. Need to see if pt can do a virtual today at 1:30pm.

## 2021-09-25 NOTE — Telephone Encounter (Signed)
LMTCB. 1:30 slot has been filled. Need to offer pt a virtual visit.

## 2022-02-05 ENCOUNTER — Other Ambulatory Visit: Payer: Self-pay | Admitting: Internal Medicine

## 2022-02-05 DIAGNOSIS — Z1231 Encounter for screening mammogram for malignant neoplasm of breast: Secondary | ICD-10-CM

## 2022-03-06 ENCOUNTER — Encounter: Payer: Self-pay | Admitting: Internal Medicine

## 2022-03-14 ENCOUNTER — Ambulatory Visit
Admission: RE | Admit: 2022-03-14 | Discharge: 2022-03-14 | Disposition: A | Discharge status: Home / Self Care | Payer: No Typology Code available for payment source | Source: Ambulatory Visit | Attending: Internal Medicine | Admitting: Internal Medicine

## 2022-03-14 DIAGNOSIS — Z1231 Encounter for screening mammogram for malignant neoplasm of breast: Secondary | ICD-10-CM | POA: Diagnosis present

## 2022-03-29 ENCOUNTER — Encounter: Payer: No Typology Code available for payment source | Admitting: Internal Medicine

## 2022-04-11 ENCOUNTER — Ambulatory Visit (INDEPENDENT_AMBULATORY_CARE_PROVIDER_SITE_OTHER): Payer: No Typology Code available for payment source | Admitting: Internal Medicine

## 2022-04-11 ENCOUNTER — Other Ambulatory Visit (HOSPITAL_COMMUNITY)
Admission: RE | Admit: 2022-04-11 | Discharge: 2022-04-11 | Disposition: A | Discharge status: Home / Self Care | Payer: No Typology Code available for payment source | Source: Ambulatory Visit | Attending: Internal Medicine | Admitting: Internal Medicine

## 2022-04-11 ENCOUNTER — Encounter: Payer: Self-pay | Admitting: Internal Medicine

## 2022-04-11 VITALS — BP 100/68 | HR 88 | Temp 98.1°F | Ht 67.0 in | Wt 165.0 lb

## 2022-04-11 DIAGNOSIS — Z0000 Encounter for general adult medical examination without abnormal findings: Secondary | ICD-10-CM

## 2022-04-11 DIAGNOSIS — R5383 Other fatigue: Secondary | ICD-10-CM

## 2022-04-11 DIAGNOSIS — Z23 Encounter for immunization: Secondary | ICD-10-CM | POA: Diagnosis not present

## 2022-04-11 DIAGNOSIS — E785 Hyperlipidemia, unspecified: Secondary | ICD-10-CM

## 2022-04-11 DIAGNOSIS — R7301 Impaired fasting glucose: Secondary | ICD-10-CM | POA: Diagnosis not present

## 2022-04-11 DIAGNOSIS — Z124 Encounter for screening for malignant neoplasm of cervix: Secondary | ICD-10-CM | POA: Insufficient documentation

## 2022-04-11 LAB — CBC WITH DIFFERENTIAL/PLATELET
Basophils Absolute: 0.1 10*3/uL (ref 0.0–0.1)
Basophils Relative: 1 % (ref 0.0–3.0)
Eosinophils Absolute: 0.1 10*3/uL (ref 0.0–0.7)
Eosinophils Relative: 1.9 % (ref 0.0–5.0)
HCT: 40.8 % (ref 36.0–46.0)
Hemoglobin: 13.8 g/dL (ref 12.0–15.0)
Lymphocytes Relative: 27.8 % (ref 12.0–46.0)
Lymphs Abs: 1.5 10*3/uL (ref 0.7–4.0)
MCHC: 33.9 g/dL (ref 30.0–36.0)
MCV: 93.1 fl (ref 78.0–100.0)
Monocytes Absolute: 0.5 10*3/uL (ref 0.1–1.0)
Monocytes Relative: 9.5 % (ref 3.0–12.0)
Neutro Abs: 3.3 10*3/uL (ref 1.4–7.7)
Neutrophils Relative %: 59.8 % (ref 43.0–77.0)
Platelets: 200 10*3/uL (ref 150.0–400.0)
RBC: 4.38 Mil/uL (ref 3.87–5.11)
RDW: 12.5 % (ref 11.5–15.5)
WBC: 5.5 10*3/uL (ref 4.0–10.5)

## 2022-04-11 LAB — COMPREHENSIVE METABOLIC PANEL
ALT: 18 U/L (ref 0–35)
AST: 20 U/L (ref 0–37)
Albumin: 4.5 g/dL (ref 3.5–5.2)
Alkaline Phosphatase: 42 U/L (ref 39–117)
BUN: 19 mg/dL (ref 6–23)
CO2: 29 mEq/L (ref 19–32)
Calcium: 9.9 mg/dL (ref 8.4–10.5)
Chloride: 101 mEq/L (ref 96–112)
Creatinine, Ser: 0.92 mg/dL (ref 0.40–1.20)
GFR: 71.2 mL/min (ref >60.00)
Glucose, Bld: 102 mg/dL — ABNORMAL HIGH (ref 70–99)
Potassium: 3.9 mEq/L (ref 3.5–5.1)
Sodium: 137 mEq/L (ref 135–145)
Total Bilirubin: 0.5 mg/dL (ref 0.2–1.2)
Total Protein: 7.3 g/dL (ref 6.0–8.3)

## 2022-04-11 LAB — LIPID PANEL
Cholesterol: 263 mg/dL — ABNORMAL HIGH (ref 0–200)
HDL: 83.8 mg/dL (ref >39.00)
LDL Cholesterol: 164 mg/dL — ABNORMAL HIGH (ref 0–99)
NonHDL: 178.71
Total CHOL/HDL Ratio: 3
Triglycerides: 74 mg/dL (ref 0.0–149.0)
VLDL: 14.8 mg/dL (ref 0.0–40.0)

## 2022-04-11 LAB — HEMOGLOBIN A1C: Hgb A1c MFr Bld: 5.4 % (ref 4.6–6.5)

## 2022-04-11 LAB — TSH: TSH: 1.73 u[IU]/mL (ref 0.35–5.50)

## 2022-04-11 LAB — LDL CHOLESTEROL, DIRECT: Direct LDL: 160 mg/dL

## 2022-04-11 NOTE — Progress Notes (Signed)
The patient is here for annual preventive examination and management of other chronic and acute problems.   The risk factors are reflected in the social history.   The roster of all physicians providing medical care to patient - is listed in the Snapshot section of the chart.   Activities of daily living:  The patient is 100% independent in all ADLs: dressing, toileting, feeding as well as independent mobility   Home safety : The patient has smoke detectors in the home. They wear seatbelts.  There are no unsecured firearms at home. There is no violence in the home.    There is no risks for hepatitis, STDs or HIV. There is no   history of blood transfusion. They have no travel history to infectious disease endemic areas of the world.   The patient has seen their dentist in the last six month. They have seen their eye doctor in the last year. The patinet  denies slight hearing difficulty with regard to whispered voices and some television programs.  They have deferred audiologic testing in the last year.  They do not  have excessive sun exposure. Discussed the need for sun protection: hats, long sleeves and use of sunscreen if there is significant sun exposure.    Diet: the importance of a healthy diet is discussed. They do have a healthy diet.   The benefits of regular aerobic exercise were discussed. The patient  exercises  3 to 5 days per week  for  60 minutes.    Depression screen: there are no signs or vegative symptoms of depression- irritability, change in appetite, anhedonia, sadness/tearfullness.   The following portions of the patient's history were reviewed and updated as appropriate: allergies, current medications, past family history, past medical history,  past surgical history, past social history  and problem list.   Visual acuity was not assessed per patient preference since the patient has regular follow up with an  ophthalmologist. Hearing and body mass index were assessed and  reviewed.    During the course of the visit the patient was educated and counseled about appropriate screening and preventive services including : fall prevention , diabetes screening, nutrition counseling, colorectal cancer screening, and recommended immunizations.    Chief Complaint:   No issues      Review of Symptoms  Patient denies headache, fevers, malaise, unintentional weight loss, skin rash, eye pain, sinus congestion and sinus pain, sore throat, dysphagia,  hemoptysis , cough, dyspnea, wheezing, chest pain, palpitations, orthopnea, edema, abdominal pain, nausea, melena, diarrhea, constipation, flank pain, dysuria, hematuria, urinary  Frequency, nocturia, numbness, tingling, seizures,  Focal weakness, Loss of consciousness,  Tremor, insomnia, depression, anxiety, and suicidal ideation.    Physical Exam:  BP 100/68 (BP Location: Left Arm, Patient Position: Sitting, Cuff Size: Normal)   Pulse 88   Temp 98.1 F (36.7 C) (Oral)   Ht 5\' 7"  (1.702 m)   Wt 165 lb (74.8 kg)   LMP 03/13/2022   SpO2 97%   BMI 25.84 kg/m    General Appearance:    Alert, cooperative, no distress, appears stated age  Head:    Normocephalic, without obvious abnormality, atraumatic  Eyes:    PERRL, conjunctiva/corneas clear, EOM's intact, fundi    benign, both eyes  Ears:    Normal TM's and external ear canals, both ears  Nose:   Nares normal, septum midline, mucosa normal, no drainage    or sinus tenderness  Throat:   Lips, mucosa, and tongue normal; teeth  and gums normal  Neck:   Supple, symmetrical, trachea midline, no adenopathy;    thyroid:  no enlargement/tenderness/nodules; no carotid   bruit or JVD  Back:     Symmetric, no curvature, ROM normal, no CVA tenderness  Lungs:     Clear to auscultation bilaterally, respirations unlabored  Chest Wall:    No tenderness or deformity   Heart:    Regular rate and rhythm, S1 and S2 normal, no murmur, rub   or gallop  Breast Exam:    No tenderness,  masses, or nipple abnormality  Abdomen:     Soft, non-tender, bowel sounds active all four quadrants,    no masses, no organomegaly  Genitalia:    Pelvic: cervix normal in appearance, external genitalia normal, no adnexal masses or tenderness, no cervical motion tenderness, rectovaginal septum normal, uterus normal size, shape, and consistency and vagina normal without discharge  Extremities:   Extremities normal, atraumatic, no cyanosis or edema  Pulses:   2+ and symmetric all extremities  Skin:   Skin color, texture, turgor normal, no rashes or lesions  Lymph nodes:   Cervical, supraclavicular, and axillary nodes normal  Neurologic:   CNII-XII intact, normal strength, sensation and reflexes    throughout     Assessment and Plan:  Encounter for preventive health examination age appropriate education and counseling updated, referrals for preventative services and immunizations addressed, dietary and smoking counseling addressed, most recent labs reviewed.  I have personally reviewed and have noted:   1) the patient's medical and social history 2) The pt's use of alcohol, tobacco, and illicit drugs 3) The patient's current medications and supplements 4) Functional ability including ADL's, fall risk, home safety risk, hearing and visual impairment 5) Diet and physical activities 6) Evidence for depression or mood disorder 7) The patient's height, weight, and BMI have been recorded in the chart 8) I have ordered and reviewed a 12 lead EKG and find that there are no acute changes and patient is in sinus rhythm.     I have made referrals, and provided counseling and education based on review of the above   Updated Medication List Outpatient Encounter Medications as of 04/11/2022  Medication Sig   cetirizine HCl (ZYRTEC) 1 MG/ML solution Take by mouth daily.   esomeprazole (NEXIUM) 20 MG capsule Take 20 mg by mouth daily as needed.   Probiotic Product (PROBIOTIC-10 PO) Take by mouth.    [DISCONTINUED] Magnesium 400 MG TABS Take 1 tablet by mouth daily.   [DISCONTINUED] Omega-3 Fatty Acids (FISH OIL) 1000 MG CAPS Take 1 capsule by mouth daily.   No facility-administered encounter medications on file as of 04/11/2022.

## 2022-04-11 NOTE — Patient Instructions (Addendum)
Good to see You!  Let me know when you complete the Shingrix series (last one done?)

## 2022-04-12 LAB — CYTOLOGY - PAP
Adequacy: ABSENT
Comment: NEGATIVE
Diagnosis: NEGATIVE
High risk HPV: NEGATIVE

## 2022-04-14 NOTE — Assessment & Plan Note (Signed)

## 2023-02-14 ENCOUNTER — Encounter: Payer: Self-pay | Admitting: Internal Medicine

## 2023-02-25 ENCOUNTER — Ambulatory Visit: Payer: No Typology Code available for payment source | Admitting: Internal Medicine

## 2023-03-03 ENCOUNTER — Encounter: Payer: Self-pay | Admitting: Internal Medicine

## 2023-03-03 ENCOUNTER — Ambulatory Visit: Payer: No Typology Code available for payment source | Admitting: Internal Medicine

## 2023-03-03 VITALS — BP 118/70 | HR 86 | Temp 98.4°F | Ht 67.0 in | Wt 168.2 lb

## 2023-03-03 DIAGNOSIS — N951 Menopausal and female climacteric states: Secondary | ICD-10-CM

## 2023-03-03 DIAGNOSIS — Z1231 Encounter for screening mammogram for malignant neoplasm of breast: Secondary | ICD-10-CM

## 2023-03-03 MED ORDER — ESTRADIOL 0.025 MG/24HR TD PTTW: 1.0000 | MEDICATED_PATCH | TRANSDERMAL | 12 refills | Status: DC

## 2023-03-03 MED ORDER — PROGESTERONE MICRONIZED 100 MG PO CAPS: 100.0000 mg | ORAL_CAPSULE | Freq: Every day | ORAL | 1 refills | Status: DC

## 2023-03-03 NOTE — Assessment & Plan Note (Signed)
Starting Vivelle and progesteron 100 mg daily

## 2023-03-03 NOTE — Progress Notes (Signed)
Subjective:  Patient ID: Brandi Jones, female    DOB: 11/15/1971  Age: 52 y.o. MRN: [id number]  CC: The primary encounter diagnosis was Breast cancer screening by mammogram. A diagnosis of Menopause syndrome was also pertinent to this visit.   HPI Brandi Jones presents for management of menopause Chief Complaint  Patient presents with   Medical Management of Chronic Issues    Discuss hormone replacement therapy   52 yr old female with no history of breast CA or DVT/PE presents with request for HRT> . Perimenopause started in 2021 .  Hse has not had a period In over 12 months.  She reports nocturnal flushing that wakes her up every 2 hours.  Has tried all of the OTC supplements without improvement.  Exhausted from the disrupted sleep.     Outpatient Medications Prior to Visit  Medication Sig Dispense Refill   cetirizine HCl (ZYRTEC) 1 MG/ML solution Take by mouth daily.     esomeprazole (NEXIUM) 20 MG capsule Take 20 mg by mouth daily as needed.     Probiotic Product (PROBIOTIC-10 PO) Take by mouth.     No facility-administered medications prior to visit.    Review of Systems;  Patient denies headache, fevers, malaise, unintentional weight loss, skin rash, eye pain, sinus congestion and sinus pain, sore throat, dysphagia,  hemoptysis , cough, dyspnea, wheezing, chest pain, palpitations, orthopnea, edema, abdominal pain, nausea, melena, diarrhea, constipation, flank pain, dysuria, hematuria, urinary  Frequency, nocturia, numbness, tingling, seizures,  Focal weakness, Loss of consciousness,  Tremor, insomnia, depression, anxiety, and suicidal ideation.      Objective:  BP 118/70   Pulse 86   Temp 98.4 F (36.9 C) (Oral)   Ht 5\' 7"  (1.702 m)   Wt 168 lb 3.2 oz (76.3 kg)   SpO2 98%   BMI 26.34 kg/m   BP Readings from Last 3 Encounters:  03/03/23 118/70  04/11/22 100/68  10/13/20 110/72    Wt Readings from Last 3 Encounters:  03/03/23 168 lb 3.2 oz (76.3 kg)   04/11/22 165 lb (74.8 kg)  10/13/20 162 lb (73.5 kg)    Physical Exam Vitals reviewed.  Constitutional:      General: She is not in acute distress.    Appearance: Normal appearance. She is normal weight. She is not ill-appearing, toxic-appearing or diaphoretic.  HENT:     Head: Normocephalic.  Eyes:     General: No scleral icterus.       Right eye: No discharge.        Left eye: No discharge.     Conjunctiva/sclera: Conjunctivae normal.  Cardiovascular:     Rate and Rhythm: Normal rate and regular rhythm.     Heart sounds: Normal heart sounds.  Pulmonary:     Effort: Pulmonary effort is normal. No respiratory distress.     Breath sounds: Normal breath sounds.  Musculoskeletal:        General: Normal range of motion.  Skin:    General: Skin is warm and dry.  Neurological:     General: No focal deficit present.     Mental Status: She is alert and oriented to person, place, and time. Mental status is at baseline.  Psychiatric:        Mood and Affect: Mood normal.        Behavior: Behavior normal.        Thought Content: Thought content normal.        Judgment: Judgment normal.    Lab Results  Component Value Date   HGBA1C 5.4 04/11/2022   HGBA1C 5.5 10/13/2020    Lab Results  Component Value Date   CREATININE 0.92 04/11/2022   CREATININE 0.89 10/13/2020   CREATININE 0.89 10/02/2018    Lab Results  Component Value Date   WBC 5.5 04/11/2022   HGB 13.8 04/11/2022   HCT 40.8 04/11/2022   PLT 200.0 04/11/2022   GLUCOSE 102 (H) 04/11/2022   CHOL 263 (H) 04/11/2022   TRIG 74.0 04/11/2022   HDL 83.80 04/11/2022   LDLDIRECT 160.0 04/11/2022   LDLCALC 164 (H) 04/11/2022   ALT 18 04/11/2022   AST 20 04/11/2022   NA 137 04/11/2022   K 3.9 04/11/2022   CL 101 04/11/2022   CREATININE 0.92 04/11/2022   BUN 19 04/11/2022   CO2 29 04/11/2022   TSH 1.73 04/11/2022   HGBA1C 5.4 04/11/2022    No results found.  Assessment & Plan:  .Breast cancer screening by  mammogram -     3D Screening Mammogram, Left and Right; Future  Menopause syndrome Assessment & Plan: She has no contraindications to HRT .  Starting Vivelle and progesterone 100 mg daily    Other orders -     Estradiol; Place 1 patch onto the skin 2 (two) times a week.  Dispense: 8 patch; Refill: 12 -     Progesterone; Take 1 capsule (100 mg total) by mouth daily.  Dispense: 90 capsule; Refill: 1     Follow-up: No follow-ups on file.   Brandi Shams, MD

## 2023-05-09 ENCOUNTER — Ambulatory Visit
Admission: RE | Admit: 2023-05-09 | Discharge: 2023-05-09 | Disposition: A | Discharge status: Home / Self Care | Payer: No Typology Code available for payment source | Source: Ambulatory Visit | Attending: Internal Medicine | Admitting: Internal Medicine

## 2023-05-09 DIAGNOSIS — Z1231 Encounter for screening mammogram for malignant neoplasm of breast: Secondary | ICD-10-CM

## 2023-08-21 ENCOUNTER — Other Ambulatory Visit: Payer: Self-pay | Admitting: Internal Medicine

## 2023-09-19 ENCOUNTER — Encounter: Payer: Self-pay | Admitting: Internal Medicine

## 2023-09-20 MED ORDER — ESTRADIOL 0.05 MG/24HR TD PTTW: 1.0000 | MEDICATED_PATCH | TRANSDERMAL | 12 refills | Status: DC

## 2023-10-20 IMAGING — MG MM DIGITAL SCREENING BILAT W/ TOMO AND CAD
8 series · 8 of 24 positions shown · non-contrast
Comparison: Previous exam(s).

CLINICAL DATA: Screening.

EXAM:
DIGITAL SCREENING BILATERAL MAMMOGRAM WITH TOMOSYNTHESIS AND CAD
TECHNIQUE: Bilateral screening digital craniocaudal and mediolateral oblique
mammograms were obtained. Bilateral screening digital breast
tomosynthesis was performed. The images were evaluated with
computer-aided detection.

[R MLO synth-2D]
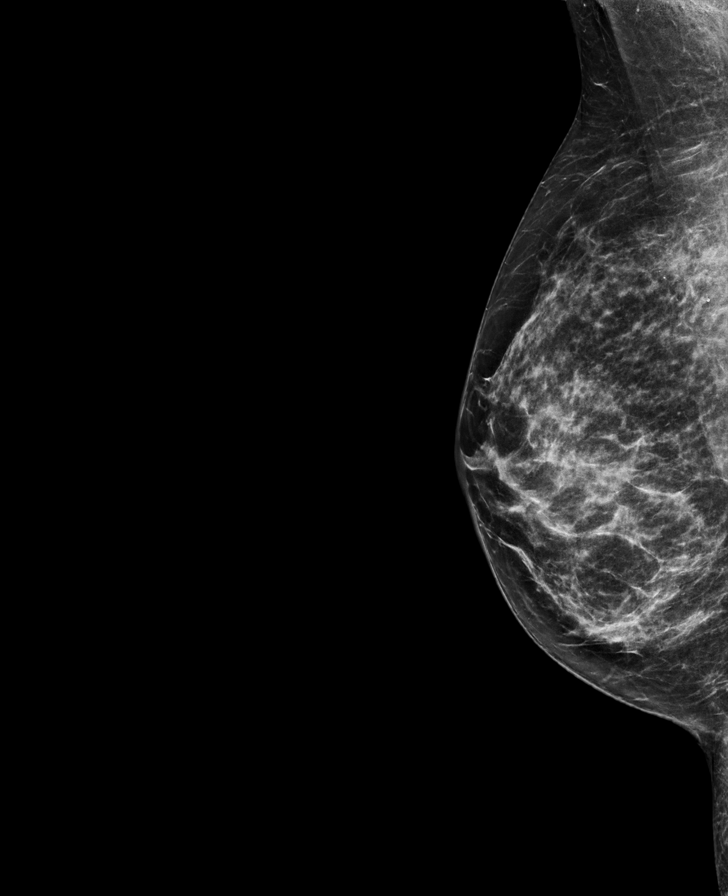

[R CC synth-2D]
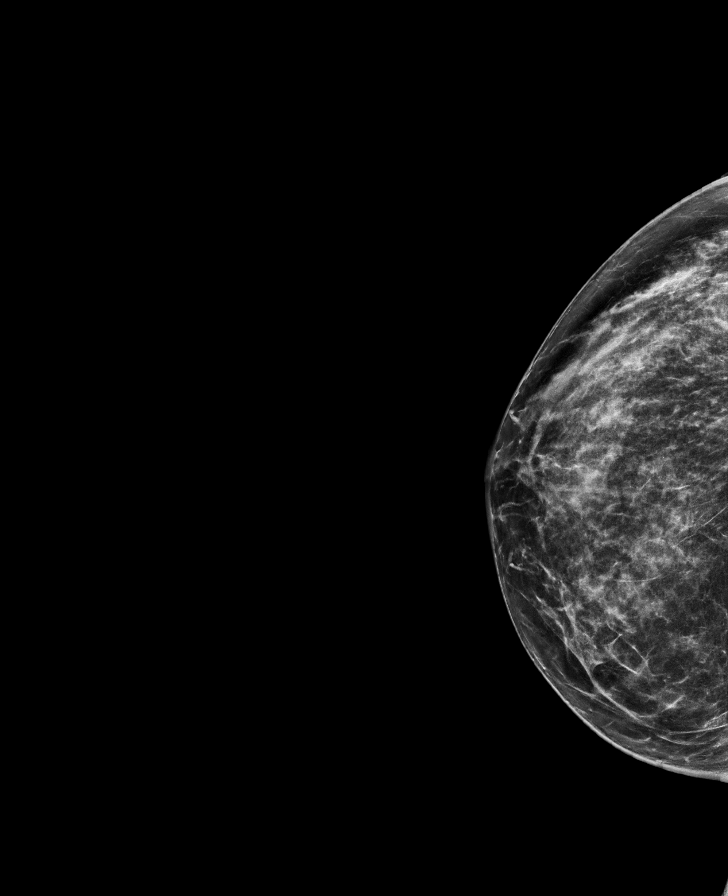

[L CC synth-2D]
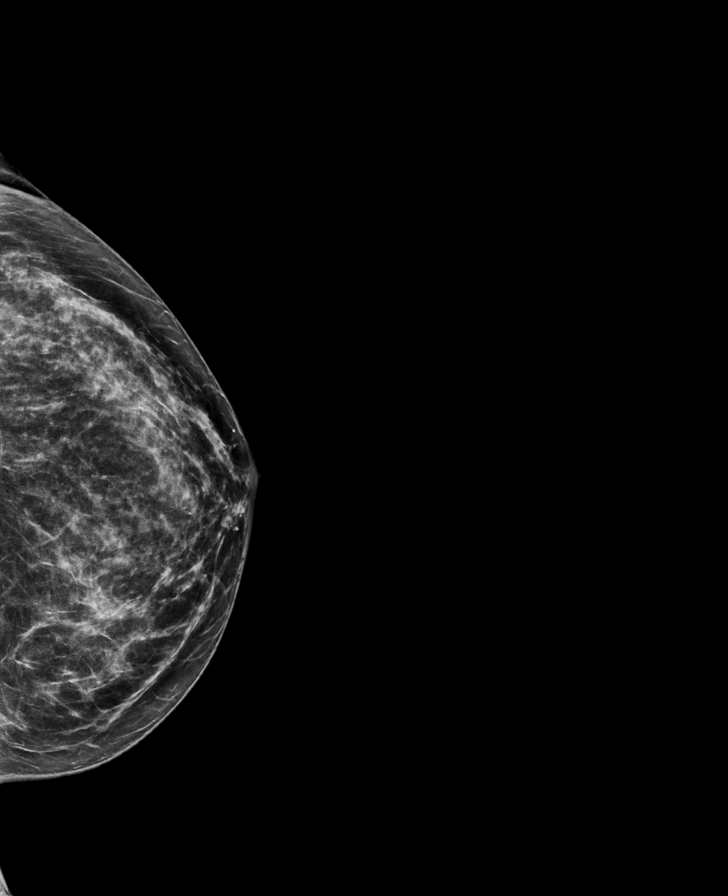

[L MLO synth-2D]
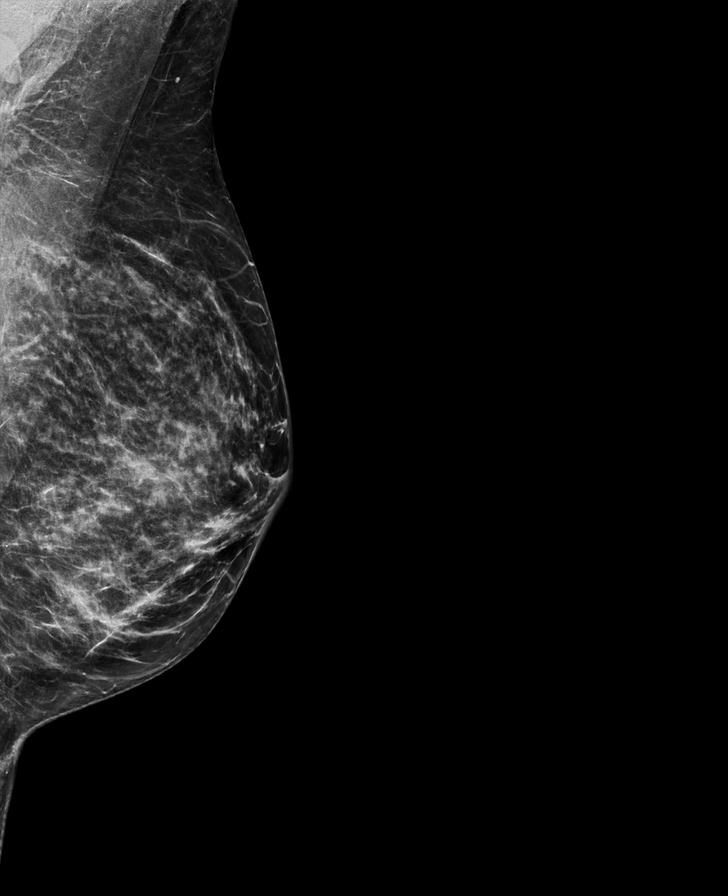

[R MLO tomo · tomo slice 31/60.0]
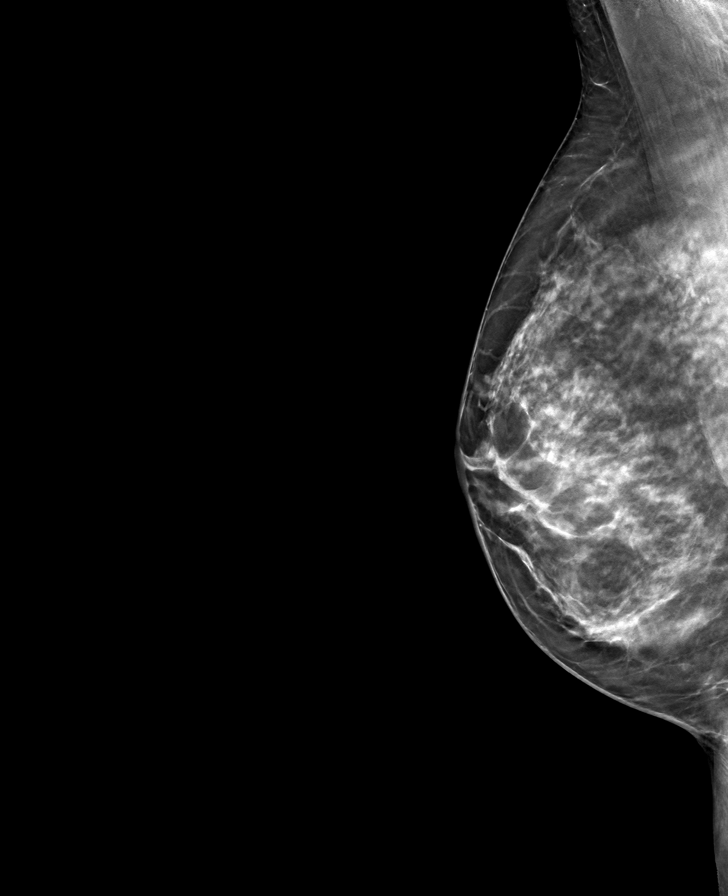

[L CC tomo · tomo slice 33/64.0]
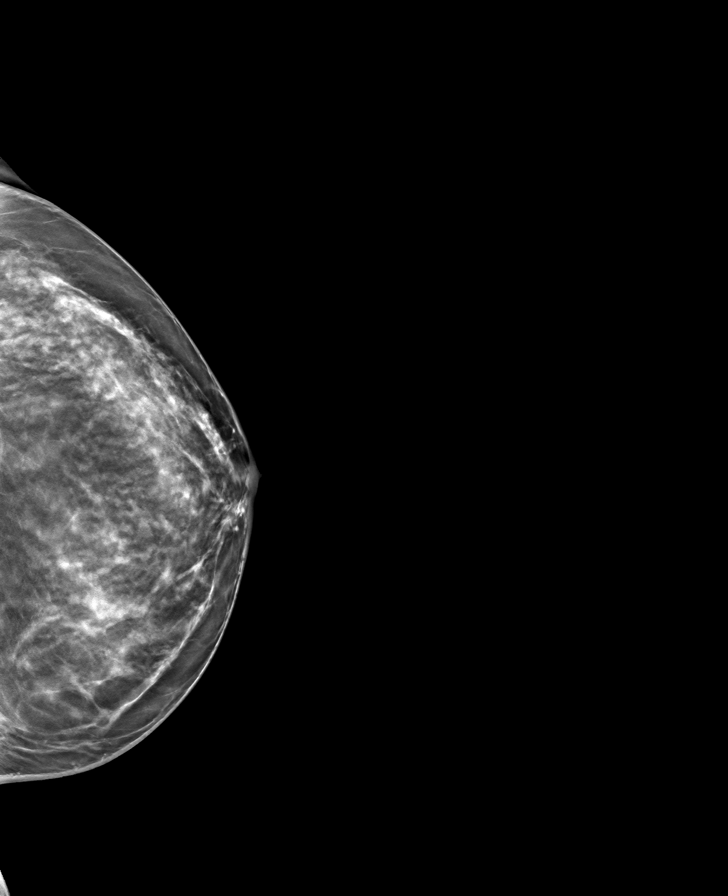

[R CC tomo · tomo slice 31/61.0]
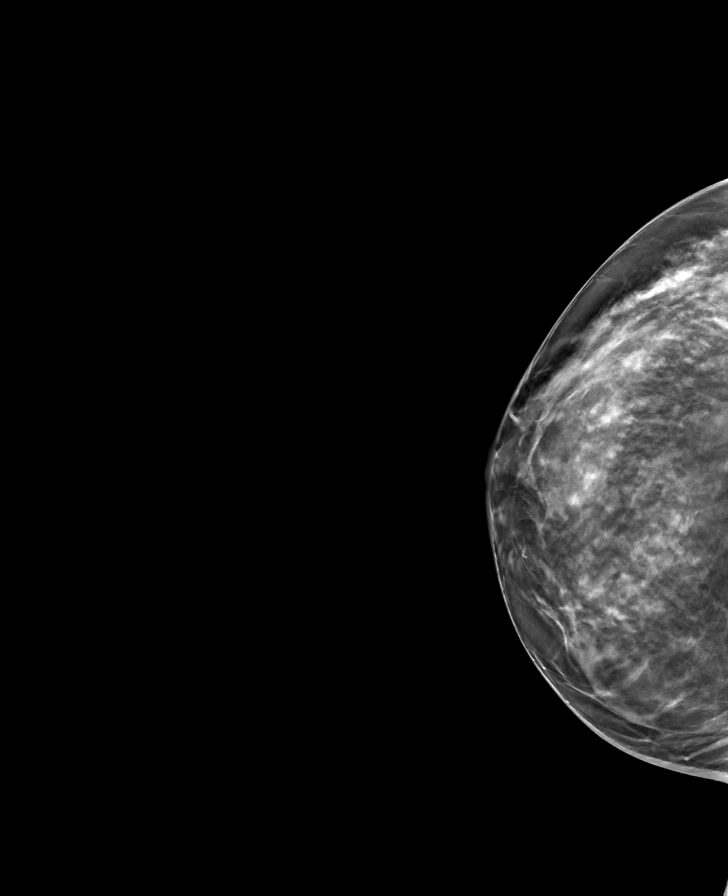

[L MLO tomo · tomo slice 31/61.0]
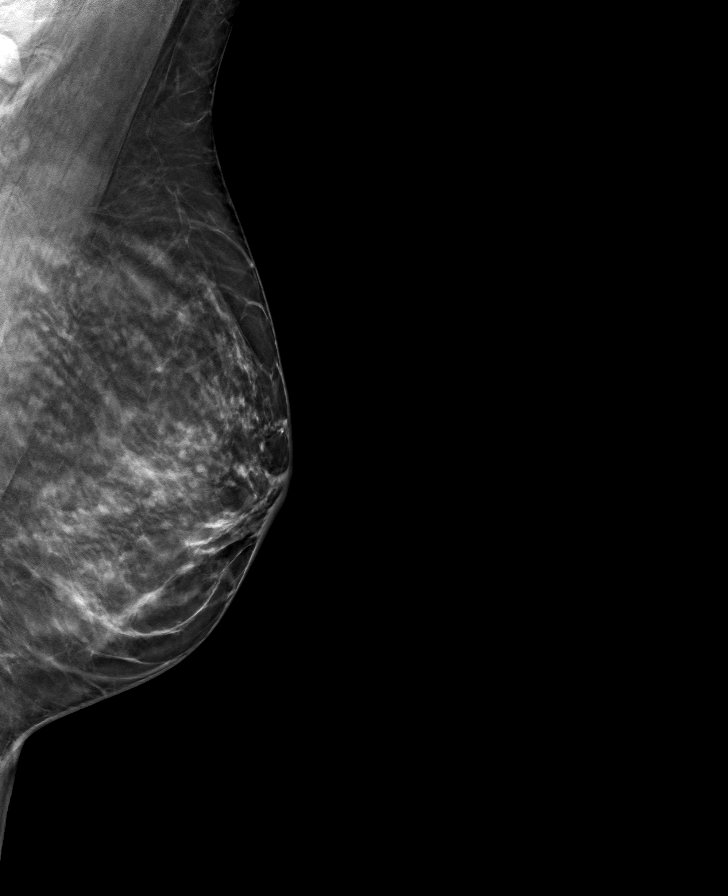

[8 of 24 positions shown; findings below may reference images not displayed]

ACR Breast Density Category c: The breast tissue is heterogeneously
dense, which may obscure small masses.
FINDINGS: There are no findings suspicious for malignancy.
IMPRESSION: No mammographic evidence of malignancy. A result letter of this
screening mammogram will be mailed directly to the patient.

RECOMMENDATION:
Screening mammogram in one year. (Code:Q3-W-BC3)

BI-RADS CATEGORY  1: Negative.

## 2023-12-11 ENCOUNTER — Telehealth: Payer: No Typology Code available for payment source | Admitting: Family Medicine

## 2023-12-11 DIAGNOSIS — J208 Acute bronchitis due to other specified organisms: Secondary | ICD-10-CM | POA: Diagnosis not present

## 2023-12-11 MED ORDER — PSEUDOEPH-BROMPHEN-DM 30-2-10 MG/5ML PO SYRP: 5.0000 mL | ORAL_SOLUTION | Freq: Four times a day (QID) | ORAL | 0 refills | Status: DC | PRN

## 2023-12-11 MED ORDER — BENZONATATE 100 MG PO CAPS: 100.0000 mg | ORAL_CAPSULE | Freq: Three times a day (TID) | ORAL | 0 refills | Status: DC | PRN

## 2023-12-11 NOTE — Progress Notes (Signed)
E-Visit for Cough   We are sorry that you are not feeling well.  Here is how we plan to help!  Based on your presentation I believe you most likely have A cough due to a virus.  This is called viral bronchitis and is best treated by rest, plenty of fluids and control of the cough.  You may use Ibuprofen or Tylenol as directed to help your symptoms.     In addition you may use A prescription cough medication called Tessalon Perles 100mg . You may take 1-2 capsules every 8 hours as needed for your cough.  I will also order you Bromfed DM cough syrup   From your responses in the eVisit questionnaire you describe inflammation in the upper respiratory tract which is causing a significant cough.  This is commonly called Bronchitis and has four common causes:   Allergies Viral Infections Acid Reflux Bacterial Infection Allergies, viruses and acid reflux are treated by controlling symptoms or eliminating the cause. An example might be a cough caused by taking certain blood pressure medications. You stop the cough by changing the medication. Another example might be a cough caused by acid reflux. Controlling the reflux helps control the cough.     HOME CARE Only take medications as instructed by your medical team. Complete the entire course of an antibiotic. Drink plenty of fluids and get plenty of rest. Avoid close contacts especially the very young and the elderly Cover your mouth if you cough or cough into your sleeve. Always remember to wash your hands A steam or ultrasonic humidifier can help congestion.   GET HELP RIGHT AWAY IF: You develop worsening fever. You become short of breath You cough up blood. Your symptoms persist after you have completed your treatment plan MAKE SURE YOU  Understand these instructions. Will watch your condition. Will get help right away if you are not doing well or get worse.    Thank you for choosing an e-visit.  Your e-visit answers were reviewed by  a board certified advanced clinical practitioner to complete your personal care plan. Depending upon the condition, your plan could have included both over the counter or prescription medications.  Please review your pharmacy choice. Make sure the pharmacy is open so you can pick up prescription now. If there is a problem, you may contact your provider through [employer] and have the prescription routed to another pharmacy.  Your safety is important to [place]. If you have drug allergies check your prescription carefully.   For the next 24 hours you can use MyChart to ask questions about today's visit, request a non-urgent call back, or ask for a work or school excuse. You will get an email in the next two days asking about your experience. I hope that your e-visit has been valuable and will speed your recovery.  I provided 5 minutes of non face-to-face time during this encounter for chart review, medication and order placement, as well as and documentation.

## 2024-02-21 ENCOUNTER — Other Ambulatory Visit: Payer: Self-pay | Admitting: Internal Medicine

## 2024-03-19 ENCOUNTER — Encounter: Payer: Self-pay | Admitting: Internal Medicine

## 2024-03-19 ENCOUNTER — Ambulatory Visit (INDEPENDENT_AMBULATORY_CARE_PROVIDER_SITE_OTHER): Admitting: Internal Medicine

## 2024-03-19 DIAGNOSIS — E785 Hyperlipidemia, unspecified: Secondary | ICD-10-CM

## 2024-03-19 DIAGNOSIS — R7301 Impaired fasting glucose: Secondary | ICD-10-CM | POA: Diagnosis not present

## 2024-03-19 DIAGNOSIS — Z0000 Encounter for general adult medical examination without abnormal findings: Secondary | ICD-10-CM

## 2024-03-19 DIAGNOSIS — Z1231 Encounter for screening mammogram for malignant neoplasm of breast: Secondary | ICD-10-CM

## 2024-03-19 DIAGNOSIS — R5383 Other fatigue: Secondary | ICD-10-CM

## 2024-03-19 DIAGNOSIS — N951 Menopausal and female climacteric states: Secondary | ICD-10-CM | POA: Diagnosis not present

## 2024-03-19 DIAGNOSIS — N950 Postmenopausal bleeding: Secondary | ICD-10-CM | POA: Insufficient documentation

## 2024-03-19 NOTE — Assessment & Plan Note (Signed)
 She has been having brief episodes of menstrual bleeding,  flow is light and dark.  Referring to gyn for endometrial ultrasound/biopsy

## 2024-03-19 NOTE — Assessment & Plan Note (Signed)
 SYMPTOMS are better managed on current transdermal estrogen and daily progesterone .

## 2024-03-19 NOTE — Assessment & Plan Note (Signed)
 age appropriate education and counseling updated, referrals for preventative services and immunizations addressed, dietary and smoking counseling addressed, most recent labs reviewed.  I have personally reviewed and have noted:   1) the patient's medical and social history 2) The pt's use of alcohol, tobacco, and illicit drugs 3) The patient's current medications and supplements 4) Functional ability including ADL's, fall risk, home safety risk, hearing and visual impairment 5) Diet and physical activities 6) Evidence for depression or mood disorder    I have made referrals, and provided counseling and education based on review of the above

## 2024-03-19 NOTE — Patient Instructions (Signed)
 I will make a referral to Brandi Jones to put both our minds at rest about your mini menses

## 2024-03-19 NOTE — Progress Notes (Addendum)
 Patient ID: [name], female    DOB: [birth year]  Age: 53 y.o. MRN: [id number]  The patient is here for annual preventive examination and management of other chronic and acute problems.   The risk factors are reflected in the social history.   The roster of all physicians providing medical care to patient - is listed in the Snapshot section of the chart.   Activities of daily living:  The patient is 100% independent in all ADLs: dressing, toileting, feeding as well as independent mobility   Home safety : The patient has smoke detectors in the home. They wear seatbelts.  There are no unsecured firearms at home. There is no violence in the home.    There is no risks for hepatitis, STDs or HIV. There is no   history of blood transfusion. They have no travel history to infectious disease endemic areas of the world.   The patient has seen their dentist in the last six month. They have seen their eye doctor in the last year. The patinet  denies slight hearing difficulty with regard to whispered voices and some television programs.  They have deferred audiologic testing in the last year.  They do not  have excessive sun exposure. Discussed the need for sun protection: hats, long sleeves and use of sunscreen if there is significant sun exposure.    Diet: the importance of a healthy diet is discussed. They do have a healthy diet.   The benefits of regular aerobic exercise were discussed. The patient  exercises   5 days per week  for  60 minutes.    Depression screen: there are no signs or vegative symptoms of depression- irritability, change in appetite, anhedonia, sadness/tearfullness.   The following portions of the patient's history were reviewed and updated as appropriate: allergies, current medications, past family history, past medical history,  past surgical history, past social history  and problem list.   Visual acuity was not assessed per patient preference since the patient has regular  follow up with an  ophthalmologist. Hearing and body mass index were assessed and reviewed.    During the course of the visit the patient was educated and counseled about appropriate screening and preventive services including : fall prevention , diabetes screening, nutrition counseling, colorectal cancer screening, and recommended immunizations.    Chief Complaint:   NONE    Review of Symptoms  Patient denies headache, fevers, malaise, unintentional weight loss, skin rash, eye pain, sinus congestion and sinus pain, sore throat, dysphagia,  hemoptysis , cough, dyspnea, wheezing, chest pain, palpitations, orthopnea, edema, abdominal pain, nausea, melena, diarrhea, constipation, flank pain, dysuria, hematuria, urinary  Frequency, nocturia, numbness, tingling, seizures,  Focal weakness, Loss of consciousness,  Tremor, insomnia, depression, anxiety, and suicidal ideation.    Physical Exam:  LMP 03/13/2022    Physical Exam Vitals reviewed.  Constitutional:      General: She is not in acute distress.    Appearance: Normal appearance. She is normal weight. She is not ill-appearing, toxic-appearing or diaphoretic.  HENT:     Head: Normocephalic.  Eyes:     General: No scleral icterus.       Right eye: No discharge.        Left eye: No discharge.     Conjunctiva/sclera: Conjunctivae normal.  Cardiovascular:     Rate and Rhythm: Normal rate and regular rhythm.     Heart sounds: Normal heart sounds.  Pulmonary:     Effort: Pulmonary effort is normal. No  respiratory distress.     Breath sounds: Normal breath sounds.  Musculoskeletal:        General: Normal range of motion.  Skin:    General: Skin is warm and dry.  Neurological:     General: No focal deficit present.     Mental Status: She is alert and oriented to person, place, and time. Mental status is at baseline.  Psychiatric:        Mood and Affect: Mood normal.        Behavior: Behavior normal.        Thought Content: Thought  content normal.        Judgment: Judgment normal.    Assessment and Plan: Menopause syndrome Assessment & Plan: SYMPTOMS are better managed on current transdermal estrogen and daily progesterone .   Impaired fasting glucose -     Comprehensive metabolic panel with GFR -     Hemoglobin A1c  Fatigue, unspecified type -     CBC with Differential/Platelet -     TSH  Hyperlipidemia, unspecified hyperlipidemia type -     LDL cholesterol, direct -     Lipid panel  Encounter for preventive health examination Assessment & Plan: age appropriate education and counseling updated, referrals for preventative services and immunizations addressed, dietary and smoking counseling addressed, most recent labs reviewed.  I have personally reviewed and have noted:   1) the patient's medical and social history 2) The pt's use of alcohol, tobacco, and illicit drugs 3) The patient's current medications and supplements 4) Functional ability including ADL's, fall risk, home safety risk, hearing and visual impairment 5) Diet and physical activities 6) Evidence for depression or mood disorder  I have made referrals, and provided counseling and education based on review of the above    Encounter for screening mammogram for malignant neoplasm of breast -     3D Screening Mammogram, Left and Right; Future  Post-menopausal bleeding Assessment & Plan: She has been having brief episodes of menstrual bleeding,  flow is light and dark.  Referring to gyn for endometrial ultrasound/biopsy  Orders: -     Ambulatory referral to Gynecology  Hyperlipidemia LDL goal <160 Assessment & Plan: 10 yr risk is < 2%  Lab Results  Component Value Date   CHOL 252 (H) 03/19/2024   HDL 85 03/19/2024   LDLCALC 151 (H) 03/19/2024   LDLDIRECT 161 (H) 03/19/2024   TRIG 69 03/19/2024   CHOLHDL 3.0 03/19/2024        No follow-ups on file.  Brandi Flax, MD

## 2024-03-21 LAB — CBC WITH DIFFERENTIAL/PLATELET
Absolute Lymphocytes: 1894 {cells}/uL (ref 850–3900)
Absolute Monocytes: 569 {cells}/uL (ref 200–950)
Basophils Absolute: 50 {cells}/uL (ref 0–200)
Basophils Relative: 0.7 %
Eosinophils Absolute: 72 {cells}/uL (ref 15–500)
Eosinophils Relative: 1 %
HCT: 42.4 % (ref 35.0–45.0)
Hemoglobin: 13.9 g/dL (ref 11.7–15.5)
MCH: 30.6 pg (ref 27.0–33.0)
MCHC: 32.8 g/dL (ref 32.0–36.0)
MCV: 93.4 fL (ref 80.0–100.0)
MPV: 11.7 fL (ref 7.5–12.5)
Monocytes Relative: 7.9 %
Neutro Abs: 4615 {cells}/uL (ref 1500–7800)
Neutrophils Relative %: 64.1 %
Platelets: 220 10*3/uL (ref 140–400)
RBC: 4.54 10*6/uL (ref 3.80–5.10)
RDW: 12.3 % (ref 11.0–15.0)
Total Lymphocyte: 26.3 %
WBC: 7.2 10*3/uL (ref 3.8–10.8)

## 2024-03-21 LAB — COMPREHENSIVE METABOLIC PANEL WITH GFR
AG Ratio: 2 (calc) (ref 1.0–2.5)
ALT: 14 U/L (ref 6–29)
AST: 20 U/L (ref 10–35)
Albumin: 4.6 g/dL (ref 3.6–5.1)
Alkaline phosphatase (APISO): 42 U/L (ref 37–153)
BUN: 18 mg/dL (ref 7–25)
CO2: 27 mmol/L (ref 20–32)
Calcium: 9.9 mg/dL (ref 8.6–10.4)
Chloride: 100 mmol/L (ref 98–110)
Creat: 0.84 mg/dL (ref 0.50–1.03)
Globulin: 2.3 g/dL (ref 1.9–3.7)
Glucose, Bld: 84 mg/dL (ref 65–99)
Potassium: 4.3 mmol/L (ref 3.5–5.3)
Sodium: 137 mmol/L (ref 135–146)
Total Bilirubin: 0.4 mg/dL (ref 0.2–1.2)
Total Protein: 6.9 g/dL (ref 6.1–8.1)
eGFR: 82 mL/min/{1.73_m2} (ref >=60)

## 2024-03-21 LAB — HEMOGLOBIN A1C
Hgb A1c MFr Bld: 5.4 % (ref <5.7)
Mean Plasma Glucose: 108 mg/dL
eAG (mmol/L): 6 mmol/L

## 2024-03-21 LAB — LIPID PANEL
Cholesterol: 252 mg/dL — ABNORMAL HIGH (ref <200)
HDL: 85 mg/dL (ref >=50)
LDL Cholesterol (Calc): 151 mg/dL — ABNORMAL HIGH
Non-HDL Cholesterol (Calc): 167 mg/dL — ABNORMAL HIGH (ref <130)
Total CHOL/HDL Ratio: 3 (calc) (ref <5.0)
Triglycerides: 69 mg/dL (ref <150)

## 2024-03-21 LAB — TSH: TSH: 1.53 m[IU]/L

## 2024-03-21 LAB — LDL CHOLESTEROL, DIRECT: Direct LDL: 161 mg/dL — ABNORMAL HIGH (ref <100)

## 2024-03-22 ENCOUNTER — Encounter: Payer: Self-pay | Admitting: Internal Medicine

## 2024-03-22 DIAGNOSIS — E785 Hyperlipidemia, unspecified: Secondary | ICD-10-CM | POA: Insufficient documentation

## 2024-03-22 NOTE — Assessment & Plan Note (Signed)
 10 yr risk is < 2%  Lab Results  Component Value Date   CHOL 252 (H) 03/19/2024   HDL 85 03/19/2024   LDLCALC 151 (H) 03/19/2024   LDLDIRECT 161 (H) 03/19/2024   TRIG 69 03/19/2024   CHOLHDL 3.0 03/19/2024

## 2024-04-09 ENCOUNTER — Encounter: Admitting: Internal Medicine

## 2024-04-13 ENCOUNTER — Encounter: Payer: Self-pay | Admitting: Obstetrics & Gynecology

## 2024-04-13 ENCOUNTER — Ambulatory Visit (INDEPENDENT_AMBULATORY_CARE_PROVIDER_SITE_OTHER): Admitting: Obstetrics & Gynecology

## 2024-04-13 VITALS — BP 125/73 | HR 74 | Ht 67.0 in | Wt 169.6 lb

## 2024-04-13 DIAGNOSIS — N950 Postmenopausal bleeding: Secondary | ICD-10-CM

## 2024-04-13 MED ORDER — MISOPROSTOL 200 MCG PO TABS: ORAL_TABLET | ORAL | 0 refills | Status: DC

## 2024-04-13 NOTE — Progress Notes (Signed)
    GYNECOLOGY PROGRESS NOTE  Subjective:    Patient ID: Brandi Jones, female    DOB: 1971-11-02, 53 y.o.   MRN: [id number]  HPI  Patient is a 53 y.o. married P0 (2 step daughters) here for PMB. She reports that her last NMP was more than a year ago. She has been on estrogen patch and prometrium  at night for about a year. She may forget to take prometrium  about 1 time per month but takes it the following morning. She has noted several occasions of 3-4 days of bleeding in the last few months.  She is sexually active and denies dyspareunia.  Her pap was normal in 2023.  The following portions of the patient's history were reviewed and updated as appropriate: allergies, current medications, past family history, past medical history, past social history, past surgical history, and problem list.  Review of Systems Pertinent items are noted in HPI.  She is a Scientist, clinical (histocompatibility and immunogenetics) at [employer].  Objective:   Blood pressure 125/73, pulse 74, height 5\' 7"  (1.702 m), weight 169 lb 9.6 oz (76.9 kg), last menstrual period 03/13/2022. Body mass index is 26.56 kg/m. Well nourished, well hydrated White female, no apparent distress She is ambulating and conversing normally. EG- no atrophy, normal Pederson spec exam reveals a normal vagina and cervix (stenotic and nulliparous) with a very small amount of old blood oozing from the os.   Assessment:   PMB  Plan:   Pelvic ultrasound If endometrium is 5 mm or greater, then I will plan for EMBX (with pretreatment with cytotec)

## 2024-04-23 ENCOUNTER — Ambulatory Visit
Admission: RE | Admit: 2024-04-23 | Discharge: 2024-04-23 | Disposition: A | Discharge status: Home / Self Care | Source: Ambulatory Visit | Attending: Obstetrics & Gynecology | Admitting: Obstetrics & Gynecology

## 2024-04-23 DIAGNOSIS — N950 Postmenopausal bleeding: Secondary | ICD-10-CM | POA: Diagnosis present

## 2024-04-27 ENCOUNTER — Encounter: Payer: Self-pay | Admitting: Internal Medicine

## 2024-05-03 ENCOUNTER — Telehealth: Payer: Self-pay | Admitting: Obstetrics & Gynecology

## 2024-05-03 ENCOUNTER — Encounter: Payer: Self-pay | Admitting: Obstetrics & Gynecology

## 2024-05-03 ENCOUNTER — Other Ambulatory Visit: Payer: Self-pay | Admitting: Obstetrics & Gynecology

## 2024-05-03 NOTE — Telephone Encounter (Signed)
 Reached out to pt to schedule an endometrial biopsy per Dr. Everardo Hitch.  Left message for pt to call back.  The following appts are available-Dr. Luster Salters  July 2 at 9:55, July 3 8:35, 9:55, 10:55  and Dr. Dell Fennel  July 8 at 10:35.

## 2024-05-04 ENCOUNTER — Other Ambulatory Visit: Payer: Self-pay | Admitting: Obstetrics & Gynecology

## 2024-05-04 MED ORDER — MISOPROSTOL 200 MCG PO TABS: ORAL_TABLET | ORAL | 0 refills | Status: DC

## 2024-05-04 NOTE — Telephone Encounter (Signed)
 Reached out to pt (2x) to schedule an endometrial biopsy per Dr. Everardo Hitch.  Left message for pt to call back to schedule.

## 2024-05-10 NOTE — Progress Notes (Unsigned)
    GYNECOLOGY PROGRESS NOTE  Subjective:    Patient ID: Brandi Jones, female    DOB: 1971/08/20, 53 y.o.   MRN: [id number]  HPI  Patient is a 53 y.o. married G0 who presents for endometrial biopsy. She has been having some postmenopausal bleeding. Ultrasound was done on 04/13/2024 and it revealed a thickened endometrium of 8 mm. Ultrasound also showed fluid near cervix with concern for cervical cancer. Her last pap in 2023 was normal. She has cervical stenosis which would explain blood near the cervix.     Objective:   Last menstrual period 03/13/2022. There is no height or weight on file to calculate BMI. Well nourished, well hydrated White female, no apparent distress She is ambulating and conversing normally.  Endometrial Biopsy Procedure Note  The patient is positioned on the exam table in the dorsal lithotomy position. Bimanual exam confirms uterine position and size. A Pederson speculum is placed into the vagina. I obtained a pap smear. Old blood was noted at the cervix. She had taken cytotec  last night. Cervix was sprayed with Hurricaine spray and then prepped with betadine. A single toothed tenaculum is placed onto the anterior lip of the cervix. I dilated the cervix and then obtained an ECC.  The pipette is placed into the endocervical canal and is advanced to the uterine fundus. Using a piston like technique, with vacuum created by withdrawing the stylus, the endometrial specimen is obtained and transferred to the biopsy container. I did this twice. Her uterus sounded to 8 cm.  Minimal bleeding is encountered. The procedure is well tolerated.  Assessment:   1. Thickened endometrium   2. PMB (postmenopausal bleeding)   3.      Cervical abnormality on gyn ultrasound  Plan:   Pap,  ECC, and EMBX  Await pathology reports   Stephane Ee, MD [facility] [place].

## 2024-05-10 NOTE — Patient Instructions (Signed)
 ENDOMETRIAL BIOPSY POST-PROCEDURE INSTRUCTIONS  You may take Ibuprofen, Aleve or Tylenol for pain if needed.  Cramping should resolve within in 24 hours.  You may have a small amount of spotting.  You should wear a mini pad for the next few days.  You may have intercourse after 24 hours.  You need to call if you have any pelvic pain, fever, heavy bleeding or foul smelling vaginal discharge.  Shower or bathe as normal  6. We will call you within one week with results or we will discuss   the results at your follow-up appointment if needed.    Endometrial Biopsy  An endometrial biopsy is a procedure where a tissue sample is removed from the lining of the uterus. This lining is called the endometrium. The tissue sample is then sent to a lab for testing. You may have this type of biopsy to check for: Cancer. Infection. Growths called polyps. Uterine bleeding that can't be explained. Tell a health care provider about: Any allergies you have. All medicines you're taking including vitamins, herbs, eye drops, creams, and over-the-counter medicines. Any problems you or family members have had with anesthesia. Any bleeding problems you have. Any surgeries you have had. Any medical problems you have. Whether you're pregnant or may be pregnant. What are the risks? Your health care provider will talk with you about risks. These may include: Bleeding. Infection. Allergic reactions to medicines. Damage to the wall of the uterus. This is rare. What happens before the procedure? Keep track of your period. You may need to have this biopsy when you're not having your period. Ask your provider about: Changing or stopping your regular medicines. These include any diabetes medicines or blood thinners you take. Taking medicines such as aspirin and ibuprofen. These medicines can thin your blood. Do not take them unless your provider tells you to. Taking over-the-counter medicines, vitamins, herbs,  and supplements. Bring a pad with you. You may need to wear one after the biopsy. Plan to have a responsible adult take you home from the hospital or clinic. You won't be allowed to drive. What happens during the procedure? A tool will be put into your vagina to hold it open. This helps your provider see the cervix. The cervix is the lowest part of the uterus. Your cervix will be cleaned with a solution that kills germs. You will be given anesthesia. This keeps you from feeling pain. It will numb your cervix. A tool called forceps will be used to hold your cervix steady. A thin tool called a uterine sound will be put through your cervix. It will be used to: Find the length of your uterus. Find where to take the sample from. A soft tube called a catheter will be put into your uterus. The catheter will remove a tissue sample. The tube and tools will be removed. The sample will be sent to a lab for testing. The procedure may vary among providers and hospitals. What happens after the procedure? Your blood pressure, heart rate, breathing rate, and blood oxygen level will be monitored until you leave the hospital or clinic. It's up to you to get the results of your procedure. Ask your provider, or the department that is doing the procedure, when your results will be ready. This information is not intended to replace advice given to you by your health care provider. Make sure you discuss any questions you have with your health care provider. Document Revised: 01/21/2023 Document Reviewed: 01/21/2023 Elsevier Patient Education  2024 [employer]

## 2024-05-11 ENCOUNTER — Other Ambulatory Visit (HOSPITAL_COMMUNITY)
Admission: RE | Admit: 2024-05-11 | Discharge: 2024-05-11 | Disposition: A | Discharge status: Home / Self Care | Source: Ambulatory Visit | Attending: Obstetrics & Gynecology | Admitting: Obstetrics & Gynecology

## 2024-05-11 ENCOUNTER — Encounter: Payer: Self-pay | Admitting: Obstetrics & Gynecology

## 2024-05-11 ENCOUNTER — Ambulatory Visit (INDEPENDENT_AMBULATORY_CARE_PROVIDER_SITE_OTHER): Admitting: Obstetrics & Gynecology

## 2024-05-11 VITALS — BP 108/60 | HR 58 | Ht 67.0 in | Wt 167.3 lb

## 2024-05-11 DIAGNOSIS — R9389 Abnormal findings on diagnostic imaging of other specified body structures: Secondary | ICD-10-CM | POA: Diagnosis present

## 2024-05-11 DIAGNOSIS — Z124 Encounter for screening for malignant neoplasm of cervix: Secondary | ICD-10-CM

## 2024-05-11 DIAGNOSIS — N858 Other specified noninflammatory disorders of uterus: Secondary | ICD-10-CM | POA: Diagnosis not present

## 2024-05-11 DIAGNOSIS — N950 Postmenopausal bleeding: Secondary | ICD-10-CM | POA: Diagnosis present

## 2024-05-12 LAB — SURGICAL PATHOLOGY

## 2024-05-13 LAB — CYTOLOGY - PAP
Comment: NEGATIVE
Diagnosis: NEGATIVE
High risk HPV: NEGATIVE

## 2024-05-14 ENCOUNTER — Encounter: Payer: Self-pay | Admitting: Obstetrics & Gynecology

## 2024-05-14 NOTE — Telephone Encounter (Signed)
 Pt saw Dr. Everardo Hitch on 05/11/2024 for the endometrial biopsy.

## 2024-05-27 ENCOUNTER — Ambulatory Visit
Admission: RE | Admit: 2024-05-27 | Discharge: 2024-05-27 | Disposition: A | Discharge status: Home / Self Care | Source: Ambulatory Visit | Attending: Internal Medicine | Admitting: Internal Medicine

## 2024-05-27 DIAGNOSIS — Z1231 Encounter for screening mammogram for malignant neoplasm of breast: Secondary | ICD-10-CM | POA: Insufficient documentation

## 2024-06-25 ENCOUNTER — Telehealth: Payer: Self-pay

## 2024-06-25 NOTE — Telephone Encounter (Signed)
 error

## 2024-06-28 ENCOUNTER — Other Ambulatory Visit: Payer: Self-pay | Admitting: Obstetrics & Gynecology

## 2024-06-28 MED ORDER — ESTRADIOL 0.025 MG/24HR TD PTTW: 1.0000 | MEDICATED_PATCH | TRANSDERMAL | 12 refills | Status: DC

## 2024-06-28 NOTE — Progress Notes (Signed)
 Vivelle  dot dose decreased from0.05 to 0.025

## 2024-08-17 ENCOUNTER — Encounter: Payer: Self-pay | Admitting: Internal Medicine

## 2024-08-18 MED ORDER — PROGESTERONE MICRONIZED 100 MG PO CAPS: 100.0000 mg | ORAL_CAPSULE | Freq: Every day | ORAL | 1 refills | Status: AC

## 2024-08-18 NOTE — Telephone Encounter (Signed)
 The progesterone  has been refilled but the estradiol  was list filled by Dr. Starla. Is it okay to refill?  Last OV: 03/19/2024 Next OV: 03/25/2025

## 2024-08-18 NOTE — Addendum Note (Signed)
 Addended by: HARRIETTE RAISIN on: 08/18/2024 10:12 AM   Modules accepted: Orders

## 2024-08-20 NOTE — Addendum Note (Signed)
 Addended by: ORLANDO KINGDOM on: 08/20/2024 08:14 AM   Modules accepted: Orders

## 2024-08-20 NOTE — Telephone Encounter (Unsigned)
 Copied from CRM [id number]. Topic: Clinical - Prescription Issue >> Aug 20, 2024  4:35 PM Roselie BROCKS wrote: Reason for CRM: Patient request to speak to clinic, clinic left message with Mrs Philips to give patient a call back

## 2024-08-21 MED ORDER — ESTRADIOL 0.05 MG/24HR TD PTTW: 1.0000 | MEDICATED_PATCH | TRANSDERMAL | 3 refills | Status: AC

## 2024-08-21 NOTE — Addendum Note (Signed)
 Addended by: MARYLYNN VERNEITA CROME on: 08/21/2024 10:22 AM   Modules accepted: Orders

## 2024-12-09 ENCOUNTER — Telehealth: Payer: Self-pay | Admitting: Physician Assistant

## 2024-12-09 DIAGNOSIS — L710 Perioral dermatitis: Secondary | ICD-10-CM

## 2024-12-09 MED ORDER — METRONIDAZOLE 0.75 % EX CREA: TOPICAL_CREAM | Freq: Two times a day (BID) | CUTANEOUS | 0 refills | Status: AC

## 2024-12-09 NOTE — Progress Notes (Signed)
 E Visit for Rash  We are sorry that you are not feeling well. Here is how we plan to help!  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   I am prescribing Metronidazole  0.75% Apply topically twice daily for 7-10 days.   HOME CARE:  Take cool showers and avoid direct sunlight. Apply cool compress or wet dressings. Take a bath in an oatmeal bath.  Sprinkle content of one Aveeno packet under running faucet with comfortably warm water.  Bathe for 15-20 minutes, 1-2 times daily.  Pat dry with a towel. Do not rub the rash. Use hydrocortisone cream. Take an antihistamine like Benadryl for widespread rashes that itch.  The adult dose of Benadryl is 25-50 mg by mouth 4 times daily. Caution:  This type of medication may cause sleepiness.  Do not drink alcohol, drive, or operate dangerous machinery while taking antihistamines.  Do not take these medications if you have prostate enlargement.  Read package instructions thoroughly on all medications that you take.  GET HELP RIGHT AWAY IF:  Symptoms don't go away after treatment. Severe itching that persists. If you rash spreads or swells. If you rash begins to smell. If it blisters and opens or develops a yellow-brown crust. You develop a fever. You have a sore throat. You become short of breath.  MAKE SURE YOU:  Understand these instructions. Will watch your condition. Will get help right away if you are not doing well or get worse.  Thank you for choosing an e-visit. Your e-visit answers were reviewed by a board certified advanced clinical practitioner to complete your personal care plan. Depending upon the condition, your plan could have included both over the counter or prescription medications. Please review your pharmacy choice. Be sure that the pharmacy you have chosen is open so that you can pick up your prescription now.  If there is a problem you may message your provider in MyChart to have the  prescription routed to another pharmacy. Your safety is important to us . If you have drug allergies check your prescription carefully.  For the next 24 hours, you can use MyChart to ask questions about todays visit, request a non-urgent call back, or ask for a work or school excuse from your e-visit provider. You will get an email in the next two days asking about your experience. I hope that your e-visit has been valuable and will speed your recovery.  I have spent 5 minutes in review of e-visit questionnaire, review and updating patient chart, medical decision making and response to patient.   Delon CHRISTELLA Dickinson, PA-C

## 2025-03-25 ENCOUNTER — Encounter: Admitting: Internal Medicine
